# Patient Record
Sex: Male | Born: 1970 | Race: White | Hispanic: No | Marital: Married | Smoking: Never smoker
Health system: Southern US, Community
[De-identification: ages and names within clinical notes are randomized; demographics above are authoritative.]

## PROBLEM LIST (undated history)

## (undated) DIAGNOSIS — K2970 Gastritis, unspecified, without bleeding: Secondary | ICD-10-CM

## (undated) DIAGNOSIS — B9681 Helicobacter pylori [H. pylori] as the cause of diseases classified elsewhere: Secondary | ICD-10-CM

## (undated) DIAGNOSIS — K219 Gastro-esophageal reflux disease without esophagitis: Secondary | ICD-10-CM

## (undated) HISTORY — DX: Helicobacter pylori (H. pylori) as the cause of diseases classified elsewhere: K29.70

## (undated) HISTORY — DX: Helicobacter pylori (H. pylori) as the cause of diseases classified elsewhere: B96.81

---
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
## 1991-08-14 HISTORY — PX: CHOLECYSTECTOMY: SHX55

## 2007-01-24 ENCOUNTER — Ambulatory Visit (HOSPITAL_COMMUNITY): Admission: RE | Admit: 2007-01-24 | Discharge: 2007-01-24 | Payer: Self-pay | Admitting: Family Medicine

## 2011-07-18 ENCOUNTER — Encounter: Payer: Self-pay | Admitting: Internal Medicine

## 2011-07-19 ENCOUNTER — Encounter: Payer: Self-pay | Admitting: Gastroenterology

## 2011-07-19 ENCOUNTER — Ambulatory Visit (INDEPENDENT_AMBULATORY_CARE_PROVIDER_SITE_OTHER): Payer: 59 | Admitting: Gastroenterology

## 2011-07-19 VITALS — BP 143/85 | HR 80 | Temp 98.0°F | Ht 71.0 in | Wt 186.8 lb

## 2011-07-19 DIAGNOSIS — R1013 Epigastric pain: Secondary | ICD-10-CM

## 2011-07-19 MED ORDER — DEXLANSOPRAZOLE 60 MG PO CPDR: 60.0000 mg | DELAYED_RELEASE_CAPSULE | Freq: Every day | ORAL | Status: DC

## 2011-07-19 MED ORDER — ESOMEPRAZOLE MAGNESIUM 40 MG PO CPDR: 40.0000 mg | DELAYED_RELEASE_CAPSULE | Freq: Every day | ORAL | Status: DC

## 2011-07-19 NOTE — Assessment & Plan Note (Signed)
40 year old male with remote hx of H.pylori gastritis (per pt). He has had an EGD by Dr. Jena Gauss > 10 years ago. We have requested reports. Continued epigastric pain, burning, nausea despite several different PPIs. Empirically treated with Prevpac in Feb 2012 without relief. No wt loss, dysphagia, GERD, melena, hematochezia. Will need EGD for ongoing epigastric pain. Concern for gastritis, PUD. As noted in HPI, 2 incidences of diarrhea after eating [place]. Likely food-related, doubt correlated with current condition.  We will switch from Prevacid to Dexilant (He has tried Prevacid, Prilosec, and OTC Prilosec) Proceed with upper endoscopy in the near future with Dr. Jena Gauss. The risks, benefits, and alternatives have been discussed in detail with patient. They have stated understanding and desire to proceed.

## 2011-07-19 NOTE — Progress Notes (Signed)
Referring Provider: No ref. provider found Primary Care Physician:  Harlow Asa, MD, MD Primary Gastroenterologist:  Dr. Jena Gauss   Chief Complaint  Patient presents with  . Abdominal Pain    gastritis    HPI:   Kent Pearson is a pleasant 40 year old male who presents today at the request of Dr. Gerda Diss secondary to chronic epigastric pain. He was actually seen by our office in the remote past. We have requested procedure reports from EGD and path from > 10 years ago. He reports at that time he was diagnosed with H.pylori gastritis.  Since Nov 2011 he has experienced intermittent epigastric pain, burning in nature. He has taken Prevacid, Prilosec, and generic Prilosec. Prevacid has seemed the best out of all of these. Pain still continues. Wakes at night with nausea. No vomiting. Was treated empirically with Prevpac in Feb 2012 with no improvement of symptoms. No melena or hematochezia. He does note 2 episodes of diarrhea after eating [place], otherwise no constipation or frequent diarrhea. No loss of appetite, wt loss, reflux, or dysphagia.   Remote hx of using Ibuprofen in the past but has avoided since Jan 2012.   Past Medical History  Diagnosis Date  . Helicobacter pylori gastritis     18+ years ago per pt report    Past Surgical History  Procedure Date  . Cholecystectomy 1993  . Collapsed lung 1988  . Eye surgery 1983    left    Current Outpatient Prescriptions  Medication Sig Dispense Refill  . dexlansoprazole (DEXILANT) 60 MG capsule Take 1 capsule (60 mg total) by mouth daily.  30 capsule  3  . lansoprazole (PREVACID) 30 MG capsule Take 30 mg by mouth daily.          Allergies as of 07/19/2011 - Review Complete 07/19/2011  Allergen Reaction Noted  . Augmentin Nausea And Vomiting 07/19/2011    Family History  Problem Relation Age of Onset  . Colon cancer Neg Hx     History   Social History  . Marital Status: Single    Spouse Name: N/A    Number of Children: N/A   . Years of Education: N/A   Occupational History  . Not on file.   Social History Main Topics  . Smoking status: Never Smoker   . Smokeless tobacco: Not on file  . Alcohol Use: No  . Drug Use: No  . Sexually Active: Not on file   Other Topics Concern  . Not on file   Social History Narrative  . No narrative on file    Review of Systems: Gen: Denies any fever, chills, loss of appetite, fatigue, weight loss. CV: Denies chest pain, heart palpitations, syncope, peripheral edema. Resp: Denies shortness of breath with rest, cough, wheezing GI: SEE HPI GU : Denies urinary burning, urinary frequency, urinary incontinence.  MS: Denies joint pain, muscle weakness, cramps, limited movement Derm: Denies rash, itching, dry skin Psych: Denies depression, anxiety, confusion or memory loss  Heme: Denies bruising, bleeding, and enlarged lymph nodes.  Physical Exam: BP 143/85  Pulse 80  Temp(Src) 98 F (36.7 C) (Temporal)  Ht 5\' 11"  (1.803 m)  Wt 186 lb 12.8 oz (84.732 kg)  BMI 26.05 kg/m2 General:   Alert and oriented. Well-developed, well-nourished, pleasant and cooperative. Head:  Normocephalic and atraumatic. Eyes:  Conjunctiva pink, sclera clear, no icterus.    Ears:  Normal auditory acuity. Nose:  No deformity, discharge,  or lesions. Mouth:  No deformity or lesions, mucosa pink and  moist.  Neck:  Supple, without mass or thyromegaly. Lungs:  Clear to auscultation bilaterally, without wheezing, rales, or rhonchi.  Heart:  S1, S2 present without murmurs noted.  Abdomen:  +BS, soft, notably tender to palpation epigastric region. non-distended. Without mass or HSM. No rebound or guarding. No hernias noted. Rectal:  Deferred  Msk:  Symmetrical without gross deformities. Normal posture. Extremities:  Without clubbing or edema. Neurologic:  Alert and  oriented x4;  grossly normal neurologically. Skin:  Intact, warm and dry without significant lesions or rashes Cervical Nodes:  No  significant cervical adenopathy. Psych:  Alert and cooperative. Normal mood and affect.

## 2011-07-19 NOTE — Patient Instructions (Signed)
Stop Prevacid. Start taking Dexilant daily, the same time each day. We have provided samples as well. We may have to discuss coverage with your insurance company, but you have tried several other medications so this may help.  We have set you up for an endoscopy with Dr. Jena Gauss in the near future. Further recommendations to follow.  Merry Christmas!

## 2011-07-20 NOTE — Progress Notes (Signed)
Cc to PCP 

## 2011-07-26 ENCOUNTER — Telehealth: Payer: Self-pay | Admitting: Urgent Care

## 2011-07-26 NOTE — Telephone Encounter (Signed)
Correction:  I can send either one to his pharmacy.

## 2011-07-26 NOTE — Telephone Encounter (Signed)
Please call patient. His insurance will not cover Dexilant until he has tried either Nexium or generic omeprazole/sodium bicarbonate. I can't stand even her [facility] pharmacy. Let me know what he prefers. Thanks

## 2011-07-31 ENCOUNTER — Encounter (HOSPITAL_COMMUNITY): Payer: Self-pay | Admitting: Pharmacy Technician

## 2011-08-01 NOTE — Telephone Encounter (Signed)
Tried to call pt but no answer

## 2011-08-09 ENCOUNTER — Telehealth: Payer: Self-pay | Admitting: Gastroenterology

## 2011-08-09 NOTE — Telephone Encounter (Signed)
Pt called to cancel EGD scheduled for 12/28- gave no reason- will call back when he is ready to reschedule - Endo made aware

## 2011-08-10 ENCOUNTER — Encounter (HOSPITAL_COMMUNITY): Admission: RE | Payer: Self-pay | Source: Ambulatory Visit

## 2011-08-10 ENCOUNTER — Ambulatory Visit (HOSPITAL_COMMUNITY): Admission: RE | Admit: 2011-08-10 | Payer: 59 | Source: Ambulatory Visit | Admitting: Internal Medicine

## 2011-08-10 SURGERY — EGD (ESOPHAGOGASTRODUODENOSCOPY)
Anesthesia: Moderate Sedation

## 2011-08-10 NOTE — Telephone Encounter (Signed)
Tried to call pt- LMOM 

## 2011-08-13 NOTE — Telephone Encounter (Signed)
Unable to reach pt- mailed letter.  

## 2012-01-27 ENCOUNTER — Emergency Department (HOSPITAL_COMMUNITY)
Admission: EM | Admit: 2012-01-27 | Discharge: 2012-01-28 | Disposition: A | Discharge status: Home / Self Care | Payer: Self-pay | Attending: Emergency Medicine | Admitting: Emergency Medicine

## 2012-01-27 ENCOUNTER — Encounter (HOSPITAL_COMMUNITY): Payer: Self-pay

## 2012-01-27 DIAGNOSIS — S91109A Unspecified open wound of unspecified toe(s) without damage to nail, initial encounter: Secondary | ICD-10-CM | POA: Insufficient documentation

## 2012-01-27 DIAGNOSIS — W2209XA Striking against other stationary object, initial encounter: Secondary | ICD-10-CM | POA: Insufficient documentation

## 2012-01-27 DIAGNOSIS — Z23 Encounter for immunization: Secondary | ICD-10-CM | POA: Insufficient documentation

## 2012-01-27 DIAGNOSIS — S91119A Laceration without foreign body of unspecified toe without damage to nail, initial encounter: Secondary | ICD-10-CM

## 2012-01-27 NOTE — ED Provider Notes (Signed)
History     CSN: [id number]  Arrival date & time 01/27/12  2219   First MD Initiated Contact with Patient 01/27/12 2303      Chief Complaint  Patient presents with  . Toe Injury    (Consider location/radiation/quality/duration/timing/severity/associated sxs/prior treatment) HPI Comments: Patient c/o pain and laceration to the right great toe after he struck his toe on a brick step earlier today.  Patient has cleaned the area PTA, but was advised by family members that he needed "to have that checked out".  He denies numbness, pain to the remaining foot or ankle or persistent bleeding.  States his tetanus status is unknown  The history is provided by the patient.    Past Medical History  Diagnosis Date  . Helicobacter pylori gastritis     18+ years ago per pt report    Past Surgical History  Procedure Date  . Cholecystectomy 1993  . Collapsed lung 1988  . Eye surgery 1983    left    Family History  Problem Relation Age of Onset  . Colon cancer Neg Hx     History  Substance Use Topics  . Smoking status: Never Smoker   . Smokeless tobacco: Not on file  . Alcohol Use: No      Review of Systems  Constitutional: Negative for fever and chills.  Genitourinary: Negative for dysuria and difficulty urinating.  Musculoskeletal: Negative for joint swelling, arthralgias and gait problem.  Skin: Positive for wound. Negative for color change.  Hematological: Does not bruise/bleed easily.  All other systems reviewed and are negative.    Allergies  Amoxicillin-pot clavulanate  Home Medications   Current Outpatient Rx  Name Route Sig Dispense Refill  . IBUPROFEN 200 MG PO TABS Oral Take 200 mg by mouth as needed. For pain    . LORATADINE 10 MG PO TABS Oral Take 10 mg by mouth daily.    Marland Kitchen OMEPRAZOLE 20 MG PO CPDR Oral Take 20 mg by mouth daily.      . ACETAMINOPHEN 500 MG PO TABS Oral Take 500 mg by mouth every 6 (six) hours as needed. Pain       BP 149/97  Pulse  81  Temp 97.9 F (36.6 C) (Oral)  Resp 22  Ht 5\' 10"  (1.778 m)  Wt 185 lb (83.915 kg)  BMI 26.54 kg/m2  SpO2 99%  Physical Exam  Nursing note and vitals reviewed. Constitutional: He is oriented to person, place, and time. He appears well-developed and well-nourished. No distress.  HENT:  Head: Normocephalic and atraumatic.  Cardiovascular: Normal rate, regular rhythm and normal heart sounds.   Pulmonary/Chest: Effort normal and breath sounds normal.  Musculoskeletal: He exhibits no edema and no tenderness.       Right foot: He exhibits laceration.  Neurological: He is alert and oriented to person, place, and time. He exhibits normal muscle tone. Coordination normal.  Skin: Laceration noted.       Superficial laceration to the plantar surface to the right great toe.  Bleeding controlled.  No muscle, nerve or tendon injury seen    ED Course  Procedures (including critical care time)  Labs Reviewed - No data to display    Dg Toe Great Right  01/28/2012  *RADIOLOGY REPORT*  Clinical Data: Caught right big toe on brick, with laceration on the plantar surface of the toe.  RIGHT GREAT TOE  Comparison: None.  Findings: The known soft tissue laceration is difficult to fully characterize on radiograph.  There is no evidence of fracture or dislocation.  Visualized joint spaces are preserved.  IMPRESSION: No evidence of fracture or dislocation.  Original Report Authenticated By: Tonia Ghent, M.D.    MDM     LACERATION REPAIR Performed by: Lyanna Blystone L. Authorized by: Maxwell Caul Consent: Verbal consent obtained. Risks and benefits: risks, benefits and alternatives were discussed Consent given by: patient Patient identity confirmed: provided demographic data Prepped and Draped in normal sterile fashion Wound explored  Laceration Location: plantar surface of right great toe Laceration Length: 3cm  No Foreign Bodies seen or palpated  Anesthesia: local  infiltration  Local anesthetic: lidocaine 1% w/o epinephrine  Anesthetic total: 2ml  Irrigation method: syringe Amount of cleaning: standard  Skin closure: steri-strips   Patient tolerance: Patient tolerated the procedure well with no immediate complications.        TDaP given.  Wound bandaged by nursing staff.  Pt agrees to return for any signs of infection   Patient / Family / Caregiver understand and agree with initial ED impression and plan with expectations set for ED visit. Pt stable in ED with no significant deterioration in condition. Pt feels improved after observation and/or treatment in ED.    Cashe Gatt L. [place], [place] 01/31/12 [phone]

## 2012-01-27 NOTE — ED Notes (Signed)
Hit my toe over brick step and peeled the meat back per pt.

## 2012-01-28 ENCOUNTER — Emergency Department (HOSPITAL_COMMUNITY): Payer: Self-pay

## 2012-01-28 MED ORDER — LIDOCAINE HCL (PF) 1 % IJ SOLN
5.0000 mL | Freq: Once | INTRAMUSCULAR | Status: AC
  Administered 2012-01-28: 5 mL
  Filled 2012-01-28: qty 5

## 2012-01-28 MED ORDER — TETANUS-DIPHTH-ACELL PERTUSSIS 5-2.5-18.5 LF-MCG/0.5 IM SUSP
0.5000 mL | Freq: Once | INTRAMUSCULAR | Status: AC
  Administered 2012-01-28: 0.5 mL via INTRAMUSCULAR
  Filled 2012-01-28 (×2): qty 0.5

## 2012-01-28 MED ORDER — TETANUS-DIPHTHERIA TOXOIDS TD 5-2 LFU IM INJ
INJECTION | INTRAMUSCULAR | Status: AC
  Filled 2012-01-28: qty 0.5

## 2012-01-28 NOTE — Discharge Instructions (Signed)
Laceration Care, Adult A laceration is a cut that goes through all layers of the skin. The cut goes into the tissue beneath the skin. HOME CARE For stitches (sutures) or staples:  Keep the cut clean and dry.   If you have a bandage (dressing), change it at least once a day. Change the bandage if it gets wet or dirty, or as told by your doctor.   Wash the cut with soap and water 2 times a day. Rinse the cut with water. Pat it dry with a clean towel.   Put a thin layer of medicated cream on the cut as told by your doctor.   You may shower after the first 24 hours. Do not soak the cut in water until the stitches are removed.   Only take medicines as told by your doctor.   Have your stitches or staples removed as told by your doctor.  For skin adhesive strips:  Keep the cut clean and dry.   Do not get the strips wet. You may take a bath, but be careful to keep the cut dry.   If the cut gets wet, pat it dry with a clean towel.   The strips will fall off on their own. Do not remove the strips that are still stuck to the cut.  For wound glue:  You may shower or take baths. Do not soak or scrub the cut. Do not swim. Avoid heavy sweating until the glue falls off on its own. After a shower or bath, pat the cut dry with a clean towel.   Do not put medicine on your cut until the glue falls off.   If you have a bandage, do not put tape over the glue.   Avoid lots of sunlight or tanning lamps until the glue falls off. Put sunscreen on the cut for the first year to reduce your scar.   The glue will fall off on its own. Do not pick at the glue.  You may need a tetanus shot if:  You cannot remember when you had your last tetanus shot.   You have never had a tetanus shot.  If you need a tetanus shot and you choose not to have one, you may get tetanus. Sickness from tetanus can be serious. GET HELP RIGHT AWAY IF:   Your pain does not get better with medicine.   Your arm, hand, leg, or  foot loses feeling (numbness) or changes color.   Your cut is bleeding.   Your joint feels weak, or you cannot use your joint.   You have painful lumps on your body.   Your cut is red, puffy (swollen), or painful.   You have a red line on the skin near the cut.   You have yellowish-white fluid (pus) coming from the cut.   You have a fever.   You have a bad smell coming from the cut or bandage.   Your cut breaks open before or after stitches are removed.   You notice something coming out of the cut, such as wood or glass.   You cannot move a finger or toe.  MAKE SURE YOU:   Understand these instructions.   Will watch your condition.   Will get help right away if you are not doing well or get worse.  Document Released: 01/16/2008 Document Revised: 07/19/2011 Document Reviewed: 01/23/2011 Exit[employer] Patient Information 2012 [employer].Stitches, Staples, or Skin Adhesive Strips  Stitches (sutures), staples, and skin adhesive strips hold   the skin together as it heals. They will usually be in place for 7 days or less. HOME CARE  Wash your hands with soap and water before and after you touch your wound.   Only take medicine as told by your doctor.   Cover your wound only if your doctor told you to. Otherwise, leave it open to air.   Do not get your stitches wet or dirty. If they get dirty, dab them gently with a clean washcloth. Wet the washcloth with soapy water. Do not rub. Pat them dry gently.   Do not put medicine or medicated cream on your stitches unless your doctor told you to.   Do not take out your own stitches or staples. Skin adhesive strips will fall off by themselves.   Do not pick at the wound. Picking can cause an infection.   Do not miss your follow-up appointment.   If you have problems or questions, call your doctor.  GET HELP RIGHT AWAY IF:   You have a temperature by mouth above 102 F (38.9 C), not controlled by medicine.   You have chills.     You have redness or pain around your stitches.   There is puffiness (swelling) around your stitches.   You notice fluid (drainage) from your stitches.   There is a bad smell coming from your wound.  MAKE SURE YOU:  Understand these instructions.   Will watch your condition.   Will get help if you are not doing well or get worse.  Document Released: 05/27/2009 Document Revised: 07/19/2011 Document Reviewed: 05/27/2009 [employer] Patient Information 2012 [employer]. 

## 2012-02-01 NOTE — ED Provider Notes (Signed)
Medical screening examination/treatment/procedure(s) were performed by non-physician practitioner and as supervising physician I was immediately available for consultation/collaboration.  Nicoletta Dress. Colon Branch, MD 02/01/12 [phone]

## 2013-03-11 ENCOUNTER — Encounter: Payer: Self-pay | Admitting: Family Medicine

## 2013-03-11 ENCOUNTER — Ambulatory Visit (INDEPENDENT_AMBULATORY_CARE_PROVIDER_SITE_OTHER): Payer: Managed Care, Other (non HMO) | Admitting: Family Medicine

## 2013-03-11 VITALS — BP 132/90 | Temp 99.4°F | Wt 193.0 lb

## 2013-03-11 DIAGNOSIS — Z0000 Encounter for general adult medical examination without abnormal findings: Secondary | ICD-10-CM

## 2013-03-11 DIAGNOSIS — R220 Localized swelling, mass and lump, head: Secondary | ICD-10-CM

## 2013-03-11 DIAGNOSIS — R5381 Other malaise: Secondary | ICD-10-CM

## 2013-03-11 DIAGNOSIS — R221 Localized swelling, mass and lump, neck: Secondary | ICD-10-CM

## 2013-03-11 MED ORDER — AZITHROMYCIN 250 MG PO TABS: ORAL_TABLET | ORAL | Status: DC

## 2013-03-11 NOTE — Progress Notes (Signed)
  Subjective:    Patient ID: Kent Pearson, male    DOB: 09/18/1970, 42 y.o.   MRN: [id number]  Neck Pain  This is a new problem. The current episode started 1 to 4 weeks ago. The pain is present in the left side. The symptoms are aggravated by swallowing. Associated symptoms include a fever and pain with swallowing. He has tried NSAIDs for the symptoms. The treatment provided moderate relief.   He states that the pain he awoke him up in the middle night about 1 AM. He denies any typical sore throat symptoms no fever chills or congestion temperature today when he came in was slightly elevated he has not been around anyone is been sick. He has noticed swelling on the left side of his neck. On physical exam he does have some tenderness on the left side of the neck slightly swollen throat is normal eardrums normal I am concerned about the possibility of a growth going on. I do believe that we need to make sure that there is not a secondary issue going on   Review of Systems  Constitutional: Positive for fever.  HENT: Positive for neck pain.    Patient does state low but a body aches today and feels like a low-grade fever. He denies soreness in the true sense of a sore throat but he does relate soreness on the left side of the neck at the proximal anterior left side of the neck he's never had this before. He denies night sweats or chills    Objective:   Physical Exam Exam some fullness on the left side there is no discrete mass. There is some tenderness on that side. His throat is nonerythematous the neck is supple there is no rashes lungs are clear no crackles heart is regular abdomen is soft no masses felt there.       Assessment & Plan:  Left side neck pain, there is some concern for the possibility of a growth present but I suspect more likely this is inflammatory lymphadenitis. We will go ahead with Z-Pak. In addition to this test lab work. I also recommended a CT scan of the neck but if his  symptoms resolve before the scan then there is no need to do this. We will touch base with ENT about his issue.

## 2013-03-12 LAB — CBC WITH DIFFERENTIAL/PLATELET
Basophils Relative: 1 % (ref 0–1)
Eosinophils Absolute: 0.1 10*3/uL (ref 0.0–0.7)
HCT: 40.9 % (ref 39.0–52.0)
Hemoglobin: 14.2 g/dL (ref 13.0–17.0)
MCH: 28.7 pg (ref 26.0–34.0)
MCHC: 34.7 g/dL (ref 30.0–36.0)
Monocytes Absolute: 0.4 10*3/uL (ref 0.1–1.0)
Monocytes Relative: 9 % (ref 3–12)
Neutro Abs: 2.5 10*3/uL (ref 1.7–7.7)

## 2013-03-13 LAB — LIPID PANEL
LDL Cholesterol: 75 mg/dL (ref 0–99)
Total CHOL/HDL Ratio: 2.9 Ratio
Triglycerides: 92 mg/dL (ref <150)
VLDL: 18 mg/dL (ref 0–40)

## 2013-03-13 LAB — BASIC METABOLIC PANEL
Chloride: 102 mEq/L (ref 96–112)
Creat: 0.85 mg/dL (ref 0.50–1.35)

## 2013-03-13 LAB — T4, FREE: Free T4: 1.35 ng/dL (ref 0.80–1.80)

## 2013-03-13 LAB — TSH: TSH: 0.61 u[IU]/mL (ref 0.350–4.500)

## 2013-03-17 ENCOUNTER — Ambulatory Visit (HOSPITAL_COMMUNITY): Payer: Managed Care, Other (non HMO)

## 2013-04-01 ENCOUNTER — Ambulatory Visit (INDEPENDENT_AMBULATORY_CARE_PROVIDER_SITE_OTHER): Payer: Managed Care, Other (non HMO) | Admitting: Family Medicine

## 2013-04-01 ENCOUNTER — Encounter: Payer: Self-pay | Admitting: Family Medicine

## 2013-04-01 VITALS — BP 126/90 | Temp 98.4°F | Ht 70.5 in | Wt 189.4 lb

## 2013-04-01 DIAGNOSIS — K2970 Gastritis, unspecified, without bleeding: Secondary | ICD-10-CM | POA: Insufficient documentation

## 2013-04-01 MED ORDER — ESOMEPRAZOLE MAGNESIUM 40 MG PO CPDR: 40.0000 mg | DELAYED_RELEASE_CAPSULE | Freq: Every day | ORAL | Status: DC

## 2013-04-01 NOTE — Progress Notes (Signed)
  Subjective:    Patient ID: Kent Pearson, male    DOB: 02-04-1971, 42 y.o.   MRN: [id number]  HPI Patient is having an burning sensation in his stomach that feels like indigestion and he is also having body aches Patient states at times he feels tired and fatigued he denies muscle aches is more aches in his abdomen a burning sensation feels like indigestion comes and goes. Does not wake him up at night. Medication seems to help but now it's not helping his well as it use to. He denies any vomiting denies any rectal bleeding no weight loss denies sweats denies appetite change denies depression no chest pressure or tightness no shortness of breath no swelling in the legs no easy bruising denies muscle aches joint pain denies fevers denies chills denies tick bites denies dysuria. Past medical history-history of reflux and gastritis. Patient does state he had H. pylori in the past but has not had any problem with it recently that he knows of. He states his appetite is good. Family history noncontributory Does not smoke It should be noted that the neck discomfort for which she was seen on last visit his goal and also his lab work looked reassuring. In on today's exam there is no findings in the neck. Review of Systems See above.    Objective:   Physical Exam Neck no masses lungs are clear no crackles heart is regular pulses normal abdomen is soft no guarding or rebound or tenderness. No masses are felt. Extremities no edema.       Assessment & Plan:  Of abdominal dyspepsia up/gastritis-switched to Nexium. Also avoid tomato-based products for which patient state he has been needing moderate amount. Also avoid all anti-inflammatories minimize caffeine and minimize chocolates. In addition to this I recommend that this patient be rechecked again in 2-3 weeks if he is not well at that point next that referral to gastroenterology for endoscopy. Patient may also need to have ultrasound I don't feel he  needs CT scan.

## 2013-04-01 NOTE — Patient Instructions (Signed)
Avoid caffiene, anti inflamatories, tomato based products and chocalates  Recheck in 3 to 4 weeks  Call if worse

## 2013-04-23 ENCOUNTER — Encounter: Payer: Self-pay | Admitting: Family Medicine

## 2013-04-23 ENCOUNTER — Ambulatory Visit (INDEPENDENT_AMBULATORY_CARE_PROVIDER_SITE_OTHER): Payer: Managed Care, Other (non HMO) | Admitting: Family Medicine

## 2013-04-23 VITALS — BP 134/92 | Ht 70.5 in | Wt 191.4 lb

## 2013-04-23 DIAGNOSIS — K2970 Gastritis, unspecified, without bleeding: Secondary | ICD-10-CM

## 2013-04-23 DIAGNOSIS — H8110 Benign paroxysmal vertigo, unspecified ear: Secondary | ICD-10-CM

## 2013-04-23 DIAGNOSIS — R1013 Epigastric pain: Secondary | ICD-10-CM

## 2013-04-23 MED ORDER — SUCRALFATE 1 G PO TABS: 1.0000 g | ORAL_TABLET | Freq: Four times a day (QID) | ORAL | Status: DC

## 2013-04-23 NOTE — Progress Notes (Signed)
  Subjective:    Patient ID: Kent Pearson, male    DOB: 10/22/1970, 42 y.o.   MRN: [id number]  HPI Here today to f/u on higher dose of Nexium.   He has been feeling dizzy and having motion sickness ever since the Nexium was increased.  Started having around then motion sickness deriving to work  Gotten worse since, getting sick just driving to work.  feelstrue dizziness with sudden motion, mot sick pills helped some.  Seems they are connnected.  Trouble with still having stomach.tender and sore in the stomach. Burp might help  Hx of biopsy showing h pylori in the past.  . Patient also notes diminished energy.  Review of Systems No vomiting no chest pain no abdominal pain no change in bowel habits ROS otherwise negative    Objective:   Physical Exam Alert no acute distress. Lungs clear. Heart regular rate and rhythm. HEENT normal. Neuro function grossly intact. Abdomen epigastric tenderness evident.       Assessment & Plan:  Impression #1 perceived side effects with vertigo apparently made worse by Nexium. Of note patient has had vertigo in the past. #2 persistent gastritis symptoms minimal improvement. #3 fatigue. Plan 25 minutes spent most in discussion. Maintain omeprazole. Add Carafate a.c. and at bedtime. GI consult rationale discussed. If fatigue persists recommend physical exam. WSL

## 2013-04-24 DIAGNOSIS — H8110 Benign paroxysmal vertigo, unspecified ear: Secondary | ICD-10-CM | POA: Insufficient documentation

## 2013-04-27 ENCOUNTER — Telehealth: Payer: Self-pay | Admitting: Family Medicine

## 2013-04-27 NOTE — Telephone Encounter (Signed)
Pt wants to know if there is another med aside from the omeprazole that he can take? He hasn't been taking it everyday, had to last night due to severe heartburn and now he is having dizzy spells again. Please call or send to [facility], but prefers OTC .   Pt would like to use Dr Karilyn Cota for the stomach scope if he can at all possible. Its who he dealt with in the past.

## 2013-04-27 NOTE — Telephone Encounter (Signed)
Left message to return call 

## 2013-04-27 NOTE — Telephone Encounter (Signed)
Send this to brendale--wants rehm.  If even low dose omeprazole causes dizziness (unusual) swith to zantac 150 bid. I already added carafate a few days ago as an addtnl med

## 2013-04-28 NOTE — Telephone Encounter (Signed)
Advised patient:  If even low dose omeprazole causes dizziness (unusual) swith to zantac 150 bid. I already added carafate a few days ago as an addtnl med   Routed message to referral coordinator.

## 2013-04-29 ENCOUNTER — Emergency Department (HOSPITAL_COMMUNITY)
Admission: EM | Admit: 2013-04-29 | Discharge: 2013-04-30 | Disposition: A | Discharge status: Home / Self Care | Payer: Managed Care, Other (non HMO) | Attending: Emergency Medicine | Admitting: Emergency Medicine

## 2013-04-29 ENCOUNTER — Encounter (HOSPITAL_COMMUNITY): Payer: Self-pay | Admitting: *Deleted

## 2013-04-29 DIAGNOSIS — K219 Gastro-esophageal reflux disease without esophagitis: Secondary | ICD-10-CM | POA: Insufficient documentation

## 2013-04-29 DIAGNOSIS — Z8619 Personal history of other infectious and parasitic diseases: Secondary | ICD-10-CM | POA: Insufficient documentation

## 2013-04-29 DIAGNOSIS — Z79899 Other long term (current) drug therapy: Secondary | ICD-10-CM | POA: Insufficient documentation

## 2013-04-29 DIAGNOSIS — H81399 Other peripheral vertigo, unspecified ear: Secondary | ICD-10-CM | POA: Insufficient documentation

## 2013-04-29 HISTORY — DX: Gastro-esophageal reflux disease without esophagitis: K21.9

## 2013-04-29 NOTE — ED Provider Notes (Signed)
CSN: [id number]     Arrival date & time 04/29/13  2201 History   First MD Initiated Contact with Patient 04/29/13 2356     Chief Complaint  Patient presents with  . Dizziness   (Consider location/radiation/quality/duration/timing/severity/associated sxs/prior Treatment) HPI This is a 42 year old male with a one-month history of dizziness. He describes the dizziness as vertigo like symptoms. Specifically he feels like the room is spinning or he is off balance. Symptoms are worse with movement of the head. There are moderate in severity. They're associated with nausea but no vomiting. The symptoms began when he had his proton pump inhibitor dose increased about a month ago. He contact his primary care physician who took him off his PPI about a week ago and he had resolution of his vertigo. He had a return of GERD symptoms so he was started on Zantac yesterday and had a return of his vertigo. It is noted that both omeprazole and Zantac list of vertigo as common side effects. He denies hearing loss.  He has been taking 12.5 mg of meclizine twice daily with partial control of the symptoms. He avoids larger doses as they make him drowsy.  Past Medical History  Diagnosis Date  . Helicobacter pylori gastritis     18+ years ago per pt report  . Acid reflux    Past Surgical History  Procedure Laterality Date  . Collapsed lung  1988  . Eye surgery  1983    left  . Cholecystectomy  1993   Family History  Problem Relation Age of Onset  . Colon cancer Neg Hx    History  Substance Use Topics  . Smoking status: Never Smoker   . Smokeless tobacco: Not on file  . Alcohol Use: No    Review of Systems  All other systems reviewed and are negative.    Allergies  Augmentin  Home Medications   Current Outpatient Rx  Name  Route  Sig  Dispense  Refill  . Meclizine HCl (BONINE) 25 MG CHEW   Oral   Chew 12.5 mg by mouth 2 (two) times daily as needed.         . Multiple Vitamins-Minerals  (CENTRUM PO)   Oral   Take 1 tablet by mouth daily.         . Omeprazole Magnesium 20.6 (20 BASE) MG CPDR   Oral   Take 1 capsule by mouth once.         . pseudoephedrine (SUDAFED) 120 MG 12 hr tablet   Oral   Take 120 mg by mouth 2 (two) times daily as needed for congestion.         . ranitidine (ZANTAC) 150 MG tablet   Oral   Take 150 mg by mouth at bedtime.         . sucralfate (CARAFATE) 1 G tablet   Oral   Take 1 tablet (1 g total) by mouth 4 (four) times daily.   120 tablet   1    BP 163/96  Pulse 81  Temp(Src) 98 F (36.7 C) (Oral)  Resp 20  Ht 5' 10.5" (1.791 m)  Wt 190 lb (86.183 kg)  BMI 26.87 kg/m2  SpO2 100%  Physical Exam General: Well-developed, well-nourished male in no acute distress; appearance consistent with age of record HENT: normocephalic; atraumatic; TMs normal Eyes: pupils equal, round and reactive to light; extraocular muscles intact; no nystagmus Neck: supple Heart: regular rate and rhythm Lungs: clear to auscultation bilaterally Abdomen: soft; nondistended;  nontender; no masses or hepatosplenomegaly Extremities: No deformity; full range of motion; pulses normal; no edema Neurologic: Awake, alert and oriented; motor function intact in all extremities and symmetric; no facial droop; normal coordination and speech; normal gait; normal finger to nose; negative Romberg Skin: Warm and dry Psychiatric: Normal mood and affect    ED Course  Procedures (including critical care time)  MDM  The patient's symptoms may be related to the omeprazole and/or Zantac but the patient had been on omeprazole in the past without difficulty. We will attempt to obtain an MRI as an outpatient to evaluate for acoustic neuroma or other pathology. He will follow up with his primary care physician Dr. Gerda Diss.    Hanley Seamen, MD 04/30/13 [phone]

## 2013-04-29 NOTE — ED Notes (Signed)
Pt reports intermittent dizzy spells for about one month ago, gradual worsening over past month.

## 2013-04-30 NOTE — ED Notes (Signed)
Patient given discharge instruction, verbalized understand. Patient ambulatory out of the department with wife  

## 2013-05-01 ENCOUNTER — Telehealth: Payer: Self-pay | Admitting: Family Medicine

## 2013-05-01 NOTE — Telephone Encounter (Signed)
Most cases of vertigo will gradually get better with time. Meclizine will take the edge off of it. If necessary Zofran can be called in for nausea. If persistent problems occur consider ENT evaluation. Also I would advise the patient to try Epley maneuver which he could look up on Youtube which shows a procedure that he can do that were often can help reset the inner ER and calm down vertigo. If things are not doing better by Monday certainly call us and let us recheck him. If patient does desire ENT referral we can put the ball in motion.

## 2013-05-01 NOTE — Telephone Encounter (Signed)
Patient says he is still having issues with severe vertigo. He had to go to the ER earlier this week. Please advise.

## 2013-05-01 NOTE — Telephone Encounter (Signed)
Patient seen Dr. Brett Canales on 04/23/13 and was seen at the ED on 04/29/13

## 2013-05-01 NOTE — Telephone Encounter (Signed)
Patient notified most cases of vertigo will gradually get better with time. Meclizine will take the edge off of it. If necessary Zofran can be called in for nausea. If persistent problems occur consider ENT evaluation. Also advised the patient to try Epley maneuver which he could look up on Youtube which shows a procedure that he can do that were often can help reset the inner ER and calm down vertigo. If things are not doing better by Monday certainly call us and let us recheck him. If patient does desire ENT referral we can put the ball in motion. Patient verbalized understanding and wants ENT referral started.

## 2013-05-03 ENCOUNTER — Other Ambulatory Visit: Payer: Self-pay | Admitting: Family Medicine

## 2013-05-03 DIAGNOSIS — H8110 Benign paroxysmal vertigo, unspecified ear: Secondary | ICD-10-CM

## 2013-05-04 ENCOUNTER — Ambulatory Visit: Payer: Managed Care, Other (non HMO) | Admitting: Family Medicine

## 2013-05-06 ENCOUNTER — Encounter: Payer: Self-pay | Admitting: Internal Medicine

## 2013-05-08 ENCOUNTER — Telehealth: Payer: Self-pay | Admitting: Family Medicine

## 2013-05-08 NOTE — Telephone Encounter (Signed)
Left message on voicemail to call Monday and schedule office visit.

## 2013-05-08 NOTE — Telephone Encounter (Signed)
Patient is calling because he is looking into getting disability due to his dizzy spells. He would like to know who he would need to speak with about getting this started? Also, he requested to speak with the doctor to make sure they are on the same page with the disability.

## 2013-05-08 NOTE — Telephone Encounter (Signed)
Per Dr. Brett Canales : Will need office visit to discuss on Monday

## 2013-05-08 NOTE — Telephone Encounter (Incomplete)
I do not discuss disability over the phone. Appt Monday

## 2013-05-12 ENCOUNTER — Ambulatory Visit (INDEPENDENT_AMBULATORY_CARE_PROVIDER_SITE_OTHER): Payer: Managed Care, Other (non HMO) | Admitting: Family Medicine

## 2013-05-12 ENCOUNTER — Encounter: Payer: Self-pay | Admitting: Family Medicine

## 2013-05-12 VITALS — BP 142/96 | Ht 71.25 in | Wt 189.0 lb

## 2013-05-12 DIAGNOSIS — K2970 Gastritis, unspecified, without bleeding: Secondary | ICD-10-CM

## 2013-05-12 DIAGNOSIS — H8110 Benign paroxysmal vertigo, unspecified ear: Secondary | ICD-10-CM

## 2013-05-12 NOTE — Progress Notes (Signed)
  Subjective:    Patient ID: Kent Pearson, male    DOB: [birth year], 42 y.o.   MRN: [id number]  HPIconsultation for disability.  Saw dr Dallie Piles. Felt that it wasn't related to true vertigo.  Two to three per day of lorazepam.  yest took a whole pill and drove to work.  Due to see dr Kendell Bane oct 14,  Stopped stomach meds.  Has been approached by boss on disability;; spells hit. Feels worthless, hits him hard,  Disability question long term,      Review of Systems ROS otherwise negative    Objective:   Physical Exam  Alert no acute distress. Lungs clear. Heart regular in rhythm. H&T normal.      Assessment & Plan:  Impression dizziness near disabling for patient, has seen the ENT specialist. They started him on anxiety medication. I explained to him that this can often help dizziness apart from its anti-anxiety qualities. Patient has been approached by his boss about potential for going on short-term disability. Patient uncertain about whether to pursue this. Extremely long discussion held about the nature of short-term disability. Plan if patient chooses to go on short-term disability I want to this. At that time we will need to do a neurology consult also, since the symptoms are severe enough to keep him from working. Many questions answered 35 minutes spent most in discussion. WSL

## 2013-05-13 ENCOUNTER — Encounter: Payer: Self-pay | Admitting: Family Medicine

## 2013-05-13 ENCOUNTER — Telehealth: Payer: Self-pay | Admitting: Family Medicine

## 2013-05-13 NOTE — Telephone Encounter (Signed)
Work note done and pt notified

## 2013-05-13 NOTE — Telephone Encounter (Signed)
LONG discusiioon yest. Three wks out. Ov with me before finished. Neuro consult asa p--brendale to work on

## 2013-05-13 NOTE — Telephone Encounter (Signed)
Pt is going to go ahead with short term Disability. Pt would like to know if he can start this today? If so he will need a work note stating how long to take him out of work. Advised pt that he would need to get his paper work from his office for short term disability to be filled out by [employer] with $10 fee.

## 2013-05-20 ENCOUNTER — Telehealth: Payer: Self-pay | Admitting: Family Medicine

## 2013-05-20 DIAGNOSIS — R42 Dizziness and giddiness: Secondary | ICD-10-CM

## 2013-05-20 NOTE — Telephone Encounter (Addendum)
Sent referral to [facility], they review, call pt to set up, and notify us when appt is scheduled.  Called & LMOM to notify pt that [facility] will be calling him to set up appt

## 2013-05-20 NOTE — Telephone Encounter (Signed)
Patient states he has seen his Eye MD and was advised it is not an eye issue.  States Eye MD states patient may need to be seen by a Neurologist for these symptoms.  Patient completed form required to have his Disability papers completed.  Fee was paid  Call patient when this form is ready to be picked up.

## 2013-05-20 NOTE — Telephone Encounter (Signed)
See 10 1 tele call--ask brendale if we iniated neuro consult??

## 2013-05-26 ENCOUNTER — Ambulatory Visit (INDEPENDENT_AMBULATORY_CARE_PROVIDER_SITE_OTHER): Payer: Managed Care, Other (non HMO) | Admitting: Gastroenterology

## 2013-05-26 ENCOUNTER — Encounter: Payer: Self-pay | Admitting: Gastroenterology

## 2013-05-26 VITALS — BP 150/90 | HR 78 | Temp 98.0°F | Ht 70.5 in | Wt 187.6 lb

## 2013-05-26 DIAGNOSIS — K219 Gastro-esophageal reflux disease without esophagitis: Secondary | ICD-10-CM

## 2013-05-26 NOTE — Assessment & Plan Note (Signed)
42 year old with intermittent GERD and dyspepsia over the past 2 years, originally set up for an EGD in 2012 but cancelled, now presenting with resolution of symptoms overall since stopping a PPI and even H2 blockers. He feels his symptoms may be dietary related. He does admit to using Tums at the most 3 times per month. As mentioned in HPI, he is experiencing debilitating episodes of dizziness and will be seeing a neurologist in the near future. He is hesitant to trial any further medications; trying to limit medications while worked up for dizziness.  I have offered him samples of Dexilant to trial X 10 days. Provided GERD handout. I feel if he has no further symptoms and improves with dietary modification, we can hold off on an EGD. However, I have asked him to call with any recurrence or persisting symptoms, and we will need to pursue an upper GI evaluation. He has no alarm features currently. Return in 3 months.

## 2013-05-26 NOTE — Progress Notes (Signed)
cc'd to pcp 

## 2013-05-26 NOTE — Progress Notes (Signed)
Referring Provider: Merlyn Albert, MD Primary Care Physician:  Harlow Asa, MD Primary GI: Dr. Jena Gauss   Chief Complaint  Patient presents with  . Gastrophageal Reflux    HPI:   Mr. Mcdonagh presents today at the request of Dr. Gerda Diss secondary to gastritis. He was seen by myself in Dec 2012 secondary to chronic epigastric pain. Notes a remote history of H.pylori gastritis. Prior medications include Prevacid, Prilosec, generic Prilosec. Last EGD in the remote past. He was given Dexilant in Dec 2012 to trial and set up for an EGD, which he then cancelled for unknown reasons. Had to trial Nexium before Dexilant would cover.   Notes in 2012 had epigastric pain, diarrhea, usually related to certain types of food. Has been on Omeprazole for the past 2 years. Didn't want to stay on a PPI forever. Trialed Nexium once daily for a month. Didn't notice much improvement. Started having dizzy spells; motion sickness going to work, worsening over time. Started having spells at home. No syncope. Went back to Omeprazole, dizziness didn't go away, switched to Zantac, persistent dizziness. Now no GI medications. Treated empirically with regimen for possible H.pylori as the culprit. Hard to tell if this improved his symptoms or Omeprazole. He feels improved WITHOUT a PPI or H2 blockers.   Symptoms were slight burning in epigastric region, slight discomfort while on a PPI. No further discomfort X 3 weeks since stopping PPI. Intermittent indigestion after stopping PPIs and H2 blockers. Now just uses Tums at a max of three times over the month. No nausea, vomiting. Nausea associated with dizzy spells. Appetite good, no unexplained weight loss. No melena, hematochezia. Sees Neurologist late October. Has seen ENT already, eye exam as well.   Past Medical History  Diagnosis Date  . Helicobacter pylori gastritis     18+ years ago per pt report  . Acid reflux     Past Surgical History  Procedure Laterality Date  .  Collapsed lung  1988    high school  . Eye surgery  1983    left  . Cholecystectomy  1993    Current Outpatient Prescriptions  Medication Sig Dispense Refill  . LORazepam (ATIVAN) 1 MG tablet Take 1 mg by mouth. Take one-half as needed.       No current facility-administered medications for this visit.    Allergies as of 05/26/2013 - Review Complete 05/26/2013  Allergen Reaction Noted  . Augmentin [amoxicillin-pot clavulanate] Nausea And Vomiting 07/19/2011    Family History  Problem Relation Age of Onset  . Colon cancer Neg Hx     History   Social History  . Marital Status: Married    Spouse Name: N/A    Number of Children: N/A  . Years of Education: N/A   Social History Main Topics  . Smoking status: Never Smoker   . Smokeless tobacco: None  . Alcohol Use: No  . Drug Use: No  . Sexual Activity: None   Other Topics Concern  . None   Social History Narrative  . None    Review of Systems: Negative unless mentioned in HPI.   Physical Exam: BP 150/90  Pulse 78  Temp(Src) 98 F (36.7 C) (Oral)  Ht 5' 10.5" (1.791 m)  Wt 187 lb 9.6 oz (85.095 kg)  BMI 26.53 kg/m2 General:   Alert and oriented. No distress noted. Pleasant and cooperative.  Head:  Normocephalic and atraumatic. Eyes:  Conjuctiva clear without scleral icterus. Mouth:  Oral mucosa pink and moist. Good  dentition. No lesions. Heart:  S1, S2 present without murmurs, rubs, or gallops. Regular rate and rhythm. Abdomen:  +BS, soft, non-tender and non-distended. No rebound or guarding. No HSM or masses noted. Msk:  Symmetrical without gross deformities. Normal posture. Extremities:  Without edema. Neurologic:  Alert and  oriented x4;  grossly normal neurologically. Skin:  Intact without significant lesions or rashes. Psych:  Alert and cooperative. Normal mood and affect.

## 2013-05-26 NOTE — Patient Instructions (Signed)
I have provided samples of Dexilant to trial for 10 days. Please call if the abdominal discomfort returns or reflux is worsened despite continuing to adapt your eating behaviors.   We may ultimately need to do an upper endoscopy.   I will see you back in 3 months. I have attached a reflux handout for you.   Good luck with your upcoming appointments!  Diet for Gastroesophageal Reflux Disease, Adult Reflux (acid reflux) is when acid from your stomach flows up into the esophagus. When acid comes in contact with the esophagus, the acid causes irritation and soreness (inflammation) in the esophagus. When reflux happens often or so severely that it causes damage to the esophagus, it is called gastroesophageal reflux disease (GERD). Nutrition therapy can help ease the discomfort of GERD. FOODS OR DRINKS TO AVOID OR LIMIT  Smoking or chewing tobacco. Nicotine is one of the most potent stimulants to acid production in the gastrointestinal tract.  Caffeinated and decaffeinated coffee and black tea.  Regular or low-calorie carbonated beverages or energy drinks (caffeine-free carbonated beverages are allowed).   Strong spices, such as black pepper, white pepper, red pepper, cayenne, curry powder, and chili powder.  Peppermint or spearmint.  Chocolate.  High-fat foods, including meats and fried foods. Extra added fats including oils, butter, salad dressings, and nuts. Limit these to less than 8 tsp per day.  Fruits and vegetables if they are not tolerated, such as citrus fruits or tomatoes.  Alcohol.  Any food that seems to aggravate your condition. If you have questions regarding your diet, call your caregiver or a registered dietitian. OTHER THINGS THAT MAY HELP GERD INCLUDE:   Eating your meals slowly, in a relaxed setting.  Eating 5 to 6 small meals per day instead of 3 large meals.  Eliminating food for a period of time if it causes distress.  Not lying down until 3 hours after  eating a meal.  Keeping the head of your bed raised 6 to 9 inches (15 to 23 cm) by using a foam wedge or blocks under the legs of the bed. Lying flat may make symptoms worse.  Being physically active. Weight loss may be helpful in reducing reflux in overweight or obese adults.  Wear loose fitting clothing EXAMPLE MEAL PLAN This meal plan is approximately 2,000 calories based on https://www.[facility].org/ meal planning guidelines. Breakfast   cup cooked oatmeal.  1 cup strawberries.  1 cup low-fat milk.  1 oz almonds. Snack  1 cup cucumber slices.  6 oz yogurt (made from low-fat or fat-free milk). Lunch  2 slice whole-wheat bread.  2 oz sliced [place].  2 tsp mayonnaise.  1 cup blueberries.  1 cup snap peas. Snack  6 whole-wheat crackers.  1 oz string cheese. Dinner   cup brown rice.  1 cup mixed veggies.  1 tsp olive oil.  3 oz grilled fish. Document Released: 07/30/2005 Document Revised: 10/22/2011 Document Reviewed: 06/15/2011 [facility] Patient Information 2014 [place], [place].

## 2013-06-02 ENCOUNTER — Ambulatory Visit (INDEPENDENT_AMBULATORY_CARE_PROVIDER_SITE_OTHER): Payer: Managed Care, Other (non HMO) | Admitting: Family Medicine

## 2013-06-02 ENCOUNTER — Encounter: Payer: Self-pay | Admitting: Family Medicine

## 2013-06-02 VITALS — Ht 71.25 in

## 2013-06-02 DIAGNOSIS — H8110 Benign paroxysmal vertigo, unspecified ear: Secondary | ICD-10-CM

## 2013-06-02 DIAGNOSIS — R51 Headache: Secondary | ICD-10-CM

## 2013-06-02 NOTE — Progress Notes (Signed)
  Subjective:    Patient ID: Kent Pearson, male    DOB: 06/19/1971, 42 y.o.   MRN: [id number]  HPI Patient arrives for a follow up on his vertigo. Ativan is helping some as long as he is taking it but the problem returns when not taking med.  Neuro due to see twenty fourth fri  Sig motion sickness with driving, looking at computer screen--nausea sens, watching scrolling read back and forth. Not as bad as reading book   Watching back and forth causes it  Takes whole pill at bed and one half or possible another half in the eve,  Still feels completely unable to work. Now he's worried because he has been experiencing some headaches. Frontal in nature. Was told by someone else this could be a sign of something serious and his brain. Due to see a neurologist soon. Once answers before then. Review of Systems No chest pain no shortness breath no abdominal pain no weight loss weight gain no loss of consciousness no seizures ROS otherwise negative    Objective:   Physical Exam  Alert no apparent distress. Talkative. HEENT normal. Lungs clear. Heart regular in rhythm. Neuro exam intact. No focal deficits.      Assessment & Plan:  Impression persistent dizziness now with other symptomatology. Patient unable to work. Has seen the ENT x2. Due to see a neurologist soon. Now accompanied by significant headaches. Plan MRI of brain rationale discussed. Followup with neurologist work excuse extended Ativan refill recheck in several weeks 25 minutes spent most in discussion

## 2013-06-03 DIAGNOSIS — R51 Headache: Secondary | ICD-10-CM | POA: Insufficient documentation

## 2013-06-03 DIAGNOSIS — R519 Headache, unspecified: Secondary | ICD-10-CM | POA: Insufficient documentation

## 2013-06-05 ENCOUNTER — Ambulatory Visit (INDEPENDENT_AMBULATORY_CARE_PROVIDER_SITE_OTHER): Payer: Managed Care, Other (non HMO) | Admitting: Neurology

## 2013-06-05 ENCOUNTER — Encounter: Payer: Self-pay | Admitting: Neurology

## 2013-06-05 VITALS — BP 150/78 | HR 78 | Temp 98.4°F | Ht 70.0 in | Wt 187.0 lb

## 2013-06-05 DIAGNOSIS — H8120 Vestibular neuronitis, unspecified ear: Secondary | ICD-10-CM

## 2013-06-05 NOTE — Progress Notes (Signed)
NEUROLOGY CONSULTATION NOTE  Kent Pearson MRN: [id number] DOB: 09/26/1970  Referring provider: Dr. Gerda Diss Primary care provider: Dr. Gerda Diss  Reason for consult:  Dizziness  HISTORY OF PRESENT ILLNESS: Kent Pearson is a 42 year old right-handed man with history of borderline hyperglycemia, borderline high blood pressure, GERD, gallbladder removal, eye surgery and collapsed lungs due to salmonella who presents for dizziness..  Records and images were personally reviewed where available.    Onset of symptoms started about 2 months ago.  He developed a bad stomach flu where he felt nauseous and weak for 5 days.  Symptoms began soon afterward.  He first noticed feeling of motion sickness while driving to work.  This slowly progressed where he was unable to tolerate driving.  He describes feeling of movement, either of his eyes or the room.  Sometimes it is a spinning sensation but not usually.  It is associated with nausea.  On one occasion he noted numbness in his hands and fingers.  He also feels slightly unsteady on his feet.  It usually lasts a few minutes.  It can be triggered by change in position, change in head movement or scanning with his eyes (such as looking at the computer or reading).  It is not associated with headache, vomiting, tinnitus, ear fullness,hearing loss, unilateral facial numbness, dysarthria or dysphagia.  He felt that symptoms have been a little better over the past week, but overall no change.  At first, he thought it may have been secondary to omeprazole, but episodes persisted after he stopped.  He started taking Meclizine, which helped with the symptoms.  He does occasionally get non-throbbing bi-frontal headaches (no nausea) which seem consistent with tension-type headaches.  He was evaluated by ENT.  Exam revealed negative Dix-Hallpike.  There was no nystagmus with pneumatic pressure on either tympanic membrane or Valsalva but did become dizzy when moving from  supine to upright position.    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  The speech reception threshold is 15dB AD and 15dB AS.  The discrimination score is 96% AD and 96% AS.  The tympanogram is normal bilaterally.  He was advised to discontinue Meclizine and try Ativan as needed.Marland Kitchen  PAST MEDICAL HISTORY: Past Medical History  Diagnosis Date  . Helicobacter pylori gastritis     18+ years ago per pt report  . Acid reflux     PAST SURGICAL HISTORY: Past Surgical History  Procedure Laterality Date  . Collapsed lung  1988    high school  . Eye surgery  1983    left  . Cholecystectomy  1993    MEDICATIONS: Current Outpatient Prescriptions on File Prior to Visit  Medication Sig Dispense Refill  . LORazepam (ATIVAN) 1 MG tablet Take 1 mg by mouth. Take one-half as needed.       No current facility-administered medications on file prior to visit.    ALLERGIES: Allergies  Allergen Reactions  . Augmentin [Amoxicillin-Pot Clavulanate] Nausea And Vomiting    Nausea and vomiting    FAMILY HISTORY: Family History  Problem Relation Age of Onset  . Colon cancer Neg Hx     SOCIAL HISTORY: History   Social History  . Marital Status: Married    Spouse Name: N/A    Number of Children: N/A  . Years of Education: N/A   Occupational History  . Not on file.   Social History Main Topics  . Smoking status: Never Smoker   . Smokeless tobacco: Not on file  .  Alcohol Use: No  . Drug Use: No  . Sexual Activity: Not on file   Other Topics Concern  . Not on file   Social History Narrative  . No narrative on file    REVIEW OF SYSTEMS: Constitutional: No fevers, chills, or sweats, no generalized fatigue, change in appetite Eyes: No visual changes, double vision, eye pain Ear, nose and throat: No hearing loss, ear pain, nasal congestion, sore throat Cardiovascular: No chest pain, palpitations Respiratory:  No shortness of breath at rest or with  exertion, wheezes GastrointestinaI: No nausea, vomiting, diarrhea, abdominal pain, fecal incontinence Genitourinary:  No dysuria, urinary retention or frequency Musculoskeletal:  No neck pain, back pain Integumentary: No rash, pruritus, skin lesions Neurological: as above Psychiatric: No depression, insomnia, anxiety Endocrine: No palpitations, fatigue, diaphoresis, mood swings, change in appetite, change in weight, increased thirst Hematologic/Lymphatic:  No anemia, purpura, petechiae. Allergic/Immunologic: no itchy/runny eyes, nasal congestion, recent allergic reactions, rashes  PHYSICAL EXAM: Filed Vitals:   06/05/13 1334  BP: 150/78  Pulse: 78  Temp: 98.4 F (36.9 C)   General: No acute distress Head:  Normocephalic/atraumatic Neck: supple, no paraspinal tenderness, full range of motion Back: No paraspinal tenderness Heart: regular rate and rhythm Lungs: Clear to auscultation bilaterally. Vascular: No carotid bruits. Neurological Exam: Mental status: alert and oriented to person, place, and time, speech fluent and not dysarthric, language intact. Cranial nerves: CN I: not tested CN II: pupils equal, round and reactive to light, visual fields intact, fundi unremarkable. CN III, IV, VI:  full range of motion, no nystagmus, no ptosis CN V: facial sensation intact CN VII: upper and lower face symmetric CN VIII: hearing intact CN IX, X: gag intact, uvula midline CN XI: sternocleidomastoid and trapezius muscles intact CN XII: tongue midline Bulk & Tone: normal, no fasciculations. Motor: 5/5 throughout Sensation: pinprick and vibration intact Deep Tendon Reflexes: 2+ throughout, toes down Finger to nose testing: normal without dysmetria Heel to shin: normal without dysmetria Gait: normal stance and stride.  Able to walk on toes, heels and in tandem. Romberg negative.  IMPRESSION: Vestibular neuritis.  Symptoms may last weeks to months.  PLAN: 1.  He has an upcoming MRI  of brain scheduled.  We will modify the order to include contrast as well (with attention to posterior fossa). 2.  If MRI unremarkable, I recommend referral to vestibular rehab.  No further neurologic workup warranted.  45 minutes spent with patient, over 50% spent counseling and coordinating care.  Thank you for allowing me to take part in the care of this patient.  Shon Millet, DO  CC:  Ardyth Gal, MD

## 2013-06-05 NOTE — Patient Instructions (Addendum)
Most likely you have an inflammation of your vestibular nerve.  This may be due to the viral illness you had earlier.  Symptoms may last weeks to months.  I don't think it is anything serious, but I do agree with the MRI of the brain.  I would like it to be performed with IV contrast as well.  If that looks okay, then I would recommend vestibular rehab.

## 2013-06-08 ENCOUNTER — Ambulatory Visit (HOSPITAL_COMMUNITY)
Admission: RE | Admit: 2013-06-08 | Discharge: 2013-06-08 | Disposition: A | Discharge status: Home / Self Care | Payer: Managed Care, Other (non HMO) | Source: Ambulatory Visit | Attending: Family Medicine | Admitting: Family Medicine

## 2013-06-08 DIAGNOSIS — J3489 Other specified disorders of nose and nasal sinuses: Secondary | ICD-10-CM | POA: Insufficient documentation

## 2013-06-08 DIAGNOSIS — R42 Dizziness and giddiness: Secondary | ICD-10-CM | POA: Insufficient documentation

## 2013-06-08 MED ORDER — GADOBENATE DIMEGLUMINE 529 MG/ML IV SOLN
15.0000 mL | Freq: Once | INTRAVENOUS | Status: AC | PRN
  Administered 2013-06-08: 15 mL via INTRAVENOUS

## 2013-06-10 ENCOUNTER — Telehealth: Payer: Self-pay | Admitting: Neurology

## 2013-06-10 ENCOUNTER — Other Ambulatory Visit: Payer: Self-pay | Admitting: Neurology

## 2013-06-10 DIAGNOSIS — H8120 Vestibular neuronitis, unspecified ear: Secondary | ICD-10-CM

## 2013-06-10 DIAGNOSIS — R42 Dizziness and giddiness: Secondary | ICD-10-CM

## 2013-06-10 DIAGNOSIS — Z0289 Encounter for other administrative examinations: Secondary | ICD-10-CM

## 2013-06-10 NOTE — Telephone Encounter (Signed)
Will not let me close either said that doctor has unfinished note and must be closed by dr.  

## 2013-06-10 NOTE — Telephone Encounter (Signed)
Left a message for the patient to return my call.  

## 2013-06-10 NOTE — Telephone Encounter (Signed)
Yes, vestibular rehab.

## 2013-06-10 NOTE — Telephone Encounter (Signed)
Pt was told MRi results normal by Dr. Cathlyn Parsons office. Pt wants to see what is next step for him. He is still out of work due to dizziness. His short term disability expires 06/15/13. CB# [phone] / Sherri S.

## 2013-06-10 NOTE — Telephone Encounter (Signed)
Please close this encounter. It states it has notes that need to be competed.

## 2013-06-10 NOTE — Telephone Encounter (Signed)
**  Dr. Everlena Cooper, MRI was normal and according to your office note, next step is Vestibular Rehab. OK to refer to [facility] for that?

## 2013-06-10 NOTE — Telephone Encounter (Signed)
Spoke with [employer]. Information given as per Dr. Everlena Cooper re: Vestibular Rehab. Pt in agreement with this plan. Will fax over the referral. Patient aware they will call him to schedule.

## 2013-06-12 ENCOUNTER — Telehealth: Payer: Self-pay | Admitting: Family Medicine

## 2013-06-12 ENCOUNTER — Other Ambulatory Visit: Payer: Self-pay | Admitting: *Deleted

## 2013-06-12 MED ORDER — LORAZEPAM 1 MG PO TABS: ORAL_TABLET | ORAL | Status: DC

## 2013-06-12 NOTE — Telephone Encounter (Signed)
Refill times one, f/u with Brett Canales if ongoing need

## 2013-06-12 NOTE — Telephone Encounter (Signed)
Med sent to pharm. Pt notified on his voicemail

## 2013-06-12 NOTE — Telephone Encounter (Signed)
Patient states he needs a prescription for LORazepam (ATIVAN) 1 MG tablet, however this medication was prescribed by Dr. Suszanne Conners.  Wants to know if our office can prescribe this medication for him.  He will not have any of these left after tomorrow  06/13/2013.  [facility]  Please call patient to discuss to see if we can do this for him today.

## 2013-06-17 ENCOUNTER — Ambulatory Visit
Payer: Managed Care, Other (non HMO) | Attending: Neurology | Admitting: Rehabilitative and Restorative Service Providers"

## 2013-06-17 DIAGNOSIS — R42 Dizziness and giddiness: Secondary | ICD-10-CM | POA: Insufficient documentation

## 2013-06-17 DIAGNOSIS — IMO0001 Reserved for inherently not codable concepts without codable children: Secondary | ICD-10-CM | POA: Insufficient documentation

## 2013-06-20 ENCOUNTER — Encounter: Payer: Self-pay | Admitting: *Deleted

## 2013-06-23 ENCOUNTER — Ambulatory Visit (INDEPENDENT_AMBULATORY_CARE_PROVIDER_SITE_OTHER): Payer: Managed Care, Other (non HMO) | Admitting: Family Medicine

## 2013-06-23 ENCOUNTER — Encounter: Payer: Self-pay | Admitting: Family Medicine

## 2013-06-23 VITALS — BP 120/86 | Ht 70.5 in | Wt 187.0 lb

## 2013-06-23 DIAGNOSIS — H8120 Vestibular neuronitis, unspecified ear: Secondary | ICD-10-CM

## 2013-06-23 DIAGNOSIS — Z029 Encounter for administrative examinations, unspecified: Secondary | ICD-10-CM

## 2013-06-23 MED ORDER — LORAZEPAM 1 MG PO TABS: ORAL_TABLET | ORAL | Status: DC

## 2013-06-23 NOTE — Progress Notes (Signed)
  Subjective:    Patient ID: Kent Pearson, male    DOB: May 18, 1971, 42 y.o.   MRN: [id number]  HPI Patient is here for a follow up visit on vertigo/dizziness. He states he is currently on short term disability due to his symptoms. He needs a refill on his Ativan and states he has no other concerns at this time.   Did neurological studies. All came back normal. Though that the viral infxn may have stimulated a case of vistibular neuritis. ,has worked thru [employer] therapy with Systems developer. PT set up for three wks. f u visit not scheduled  Dealing with unsteadiness at times   Unfortunately ongoing dizziness. States he is unable to work at all. We suddenly strike him. He'll had bouts of nausea with it. Unsteadiness. He's afraid to drive for long distances.   Doing vigorous activity around the house with some success but other times triggering dizziness. Review of Systems No chest pain no back pain no abdominal pain ROS otherwise negative    Objective:   Physical Exam  Alert HEENT normal. Lungs clear. Heart regular in rhythm. Cerebellar function intact. No nystatin meds.      Assessment & Plan:  Impression 1 Vertigo with vestibular neuritis. Dizziness not better, unable to do current activities. Patient in the midst of this to do her training via physical therapist under neurologist direction. Plan work excuse given recheck in several weeks. Walking encouraged to when possible. Ativan refill. Side effects benefits discussed. WSL

## 2013-06-24 ENCOUNTER — Ambulatory Visit: Payer: Managed Care, Other (non HMO) | Admitting: Rehabilitative and Restorative Service Providers"

## 2013-07-01 ENCOUNTER — Ambulatory Visit: Payer: Managed Care, Other (non HMO) | Admitting: Rehabilitative and Restorative Service Providers"

## 2013-07-15 ENCOUNTER — Ambulatory Visit
Payer: Managed Care, Other (non HMO) | Attending: Neurology | Admitting: Rehabilitative and Restorative Service Providers"

## 2013-07-15 DIAGNOSIS — R42 Dizziness and giddiness: Secondary | ICD-10-CM | POA: Insufficient documentation

## 2013-07-15 DIAGNOSIS — IMO0001 Reserved for inherently not codable concepts without codable children: Secondary | ICD-10-CM | POA: Insufficient documentation

## 2013-07-21 ENCOUNTER — Other Ambulatory Visit: Payer: Self-pay | Admitting: Family Medicine

## 2013-07-21 ENCOUNTER — Telehealth: Payer: Self-pay | Admitting: Family Medicine

## 2013-07-21 NOTE — Telephone Encounter (Signed)
NTSW I never discuss disability issues over the phone--invariably a very long discussion. You can tell him our notes and consultants' notes do not reflect that at all. He can sched appt to discuss if he'd like to talk dierectly with me on this

## 2013-07-21 NOTE — Telephone Encounter (Signed)
Ps due for f u visit anyway

## 2013-07-21 NOTE — Telephone Encounter (Signed)
Patient says that his claim for short term disability has been dropped due to his paperwork saying his only issue was car sickness. He needs someone to call him back. I told him I would let Alcario Drought know, but he says no to send to Dr Brett Canales to handle.

## 2013-07-22 ENCOUNTER — Ambulatory Visit: Payer: Managed Care, Other (non HMO) | Admitting: Rehabilitative and Restorative Service Providers"

## 2013-07-22 ENCOUNTER — Telehealth: Payer: Self-pay | Admitting: Family Medicine

## 2013-07-22 NOTE — Telephone Encounter (Signed)
Patient wants to come in today or tomorrow to talk about his disability claim. He was not satisfied with the next available which is on 07/29/13. He says he is not getting paid and will be broke if he waits that long to come in.

## 2013-07-22 NOTE — Telephone Encounter (Signed)
Notified patient. I transferred him up front to make an appt w/ Dr. Steve.  

## 2013-07-22 NOTE — Telephone Encounter (Signed)
Ov tom 

## 2013-07-22 NOTE — Telephone Encounter (Signed)
Notified patient and transferred up front for appt.

## 2013-07-22 NOTE — Telephone Encounter (Signed)
Notified patient. I transferred him up front to make an appt w/ Dr. Brett Canales.

## 2013-07-23 ENCOUNTER — Ambulatory Visit (INDEPENDENT_AMBULATORY_CARE_PROVIDER_SITE_OTHER): Payer: Managed Care, Other (non HMO) | Admitting: Family Medicine

## 2013-07-23 ENCOUNTER — Ambulatory Visit: Payer: Managed Care, Other (non HMO) | Admitting: Family Medicine

## 2013-07-23 ENCOUNTER — Encounter: Payer: Self-pay | Admitting: Family Medicine

## 2013-07-23 VITALS — BP 128/90 | Ht 70.0 in | Wt 185.1 lb

## 2013-07-23 DIAGNOSIS — H8120 Vestibular neuronitis, unspecified ear: Secondary | ICD-10-CM

## 2013-07-23 NOTE — Progress Notes (Signed)
   Subjective:    Patient ID: Kent Pearson, male    DOB: [birth year], 42 y.o.   MRN: [id number]  HPI Patient is here to follow up visit on dizziness. He is here to discuss his disability paperwork. The wrong diagnose was put on his original paperwork and now his disability has been cancelled. He will not receive payment for this month and may have to pay back previous payments. He needs this corrected by our office immediately.   When looking at the computer, pt develops severe dizziness. Unable to look at screen for more than just a few minutes without getting very sixk Pt is driving, but only short distances and around town. Developing severe nausea. Patient states he absolutely cannot participate in any kind of a sustained activities on the computer. This is required for his work. He becomes violently nauseated. Extremely dizzy unsteady.  Taking the generic ativan 1 mg at bed and two half pills during day. Now off meclizine didn't help and dr Christain [place] side could hurt.  Neurologist set up with the rehab, seems to be helping some, but not enough yet,  [place] drive [place],  Review of Systems No true loss of consciousness no seizures no abdominal pain no change in bowel habits no shortness of breath ROS otherwise negative    Objective:   Physical Exam   Alert mild malaise. HEENT normal. Lungs clear. Heart regular in rhythm. Neuro exam intact.     Assessment & Plan:  Impression vestibular neuritis with severe disability at this point. Unable to look at computer screen for sustained periods. Unable to drive except for extremely short distances. In the midst of therapy via the physical therapist. This was initiated by patient's neurologist. Plan encouraged to get back with neurologist. I advised patient that I'm going to let the neurologist on with his major management at this point. He definitely should qualify for disability at this time to to his severe symptomatology. This is also  discussed. 35-40 minutes spent with patient most in discussion. WSL

## 2013-07-24 ENCOUNTER — Telehealth: Payer: Self-pay | Admitting: Family Medicine

## 2013-07-24 ENCOUNTER — Other Ambulatory Visit: Payer: Self-pay

## 2013-07-24 NOTE — Telephone Encounter (Signed)
Pt stated that you mentioned to him at his last office visit you were going to get some information together to have sent to his short term disability folks. Not sure if you were going to do a letter or something.  Please advise, I told pt that I would call him Mon 12/15 or Tues 12/16 with an update.  I also explained that [employer] handles those types of forms.

## 2013-07-24 NOTE — Telephone Encounter (Signed)
Medication faxed to pharmacy. Patient was notified.  

## 2013-07-24 NOTE — Telephone Encounter (Signed)
Patient needs Rx for ativan   [facility]

## 2013-07-26 NOTE — Telephone Encounter (Signed)
No, I advised him I would dictate a very thorough note stating why he cannot work at this time, and that we would be happy to fill out any further forms hes gets to [place] thru his insurance company, and, sine this is turning into a at least a medium term disability from dizziness it would be in his best interest to follow up exclusively with the neurologist because they will stand a better chance of carrying more weight with his insurance company going forward (I did not sched follow up)

## 2013-07-28 NOTE — Telephone Encounter (Signed)
[facility]

## 2013-07-29 ENCOUNTER — Ambulatory Visit: Payer: Managed Care, Other (non HMO) | Admitting: Rehabilitative and Restorative Service Providers"

## 2013-07-30 ENCOUNTER — Telehealth: Payer: Self-pay | Admitting: Neurology

## 2013-07-30 NOTE — Telephone Encounter (Signed)
Pt canc Monday 08/03/13 follow up appt w/ Dr. Everlena Cooper. He will call if follow up needed / Sherri S.

## 2013-07-30 NOTE — Telephone Encounter (Signed)
Spoke with pt 07/29/13 around 3:45.  Explained that Dr. Brett Canales will do thorough note for patient.  He is appreciative, let him know when ready.  FYI - Patient states that one day he went without the Ativan due to some refill confusion and he experienced some withdrawal symptoms. He's now weaning himself off the Ativan.  States that he feels better about the time the next dose is due.  States he's cutting pill and taking 1/4 in the morning and 1/2 in the evening about 12 hours apart.    Patient will use the note to appeal the short term disability denial.  Will let us know if there's anything we can do or any new forms to complete.  I also explained to pt to further follow up with this issue with the neurologist due to the complexity of the problem

## 2013-07-30 NOTE — Telephone Encounter (Signed)
brend the note i meant was the progress note ict in extreme detail to help support disability claim already done

## 2013-07-30 NOTE — Telephone Encounter (Signed)
brendale the note i mreant was the progress note, already done and very detailed to help bolster disability status

## 2013-08-03 ENCOUNTER — Ambulatory Visit: Payer: Managed Care, Other (non HMO) | Admitting: Neurology

## 2013-08-04 ENCOUNTER — Ambulatory Visit: Payer: Managed Care, Other (non HMO) | Admitting: Rehabilitative and Restorative Service Providers"

## 2013-08-17 NOTE — Telephone Encounter (Signed)
Something is missing in this encounter and not giving me access to close this encounter. Please fix and close encounter. °

## 2013-08-17 NOTE — Telephone Encounter (Signed)
tis is a bit weird, can you all tell me date because i have nothing in the pt chart list on this??

## 2013-08-17 NOTE — Telephone Encounter (Signed)
Will not let me close either said that doctor has unfinished note and must be closed by dr.

## 2013-08-20 ENCOUNTER — Telehealth: Payer: Self-pay | Admitting: Family Medicine

## 2013-08-20 NOTE — Telephone Encounter (Signed)
Ok write it, plus copylast note

## 2013-08-20 NOTE — Telephone Encounter (Signed)
Pt requesting a return to work note for 08/14/13, states he went back to work, is not 100% symptom free, but is able to work some now.  Also needs a copy of his last office note so that he can turn it into his disability insurance folks

## 2013-08-21 ENCOUNTER — Encounter: Payer: Self-pay | Admitting: Family Medicine

## 2013-08-25 ENCOUNTER — Ambulatory Visit
Payer: Managed Care, Other (non HMO) | Attending: Neurology | Admitting: Rehabilitative and Restorative Service Providers"

## 2013-08-25 DIAGNOSIS — R42 Dizziness and giddiness: Secondary | ICD-10-CM | POA: Insufficient documentation

## 2013-08-25 DIAGNOSIS — IMO0001 Reserved for inherently not codable concepts without codable children: Secondary | ICD-10-CM | POA: Insufficient documentation

## 2013-08-26 ENCOUNTER — Ambulatory Visit: Payer: Managed Care, Other (non HMO) | Admitting: Gastroenterology

## 2013-10-29 ENCOUNTER — Telehealth: Payer: Self-pay | Admitting: Neurology

## 2013-10-29 NOTE — Telephone Encounter (Signed)
Patient called today stating he is having problems again they started this past Saturday with the vertigo like feeling . He states for a few months he has done good he has been able to work a full 8 hours . He has weaned off of the 1/4 tab Ativan and is wondering if this might be the cause of his symptoms appearing again ? He is also asking if valium might be abetter choice ? Please  Advise

## 2013-10-29 NOTE — Telephone Encounter (Signed)
Pt is requesting to speak to a nurse regarding his condition. He states he is not sure if his sinuses are acting up or if it could be something else.

## 2013-11-09 ENCOUNTER — Encounter: Payer: Self-pay | Admitting: Neurology

## 2013-11-09 ENCOUNTER — Ambulatory Visit (INDEPENDENT_AMBULATORY_CARE_PROVIDER_SITE_OTHER): Payer: Managed Care, Other (non HMO) | Admitting: Neurology

## 2013-11-09 VITALS — BP 132/74 | HR 70 | Temp 97.5°F | Resp 16 | Ht 70.5 in | Wt 178.2 lb

## 2013-11-09 DIAGNOSIS — H832X9 Labyrinthine dysfunction, unspecified ear: Secondary | ICD-10-CM

## 2013-11-09 MED ORDER — LORAZEPAM 0.5 MG PO TABS: 0.5000 mg | ORAL_TABLET | Freq: Three times a day (TID) | ORAL | Status: DC | PRN

## 2013-11-09 NOTE — Progress Notes (Signed)
NEUROLOGY FOLLOW UP OFFICE NOTE  Kent Pearson [id number]  HISTORY OF PRESENT ILLNESS: Kent Pearson is a 43 year old right-handed man with history of borderline hyperglycemia, borderline high blood pressure, GERD, gallbladder removal, eye surgery and collapsed lungs due to salmonella who follows up for dizziness.  He is accompanied by his wife.  Records and images were personally reviewed where available.    UPDATE: He began using the Ativan as needed, which was helpful.  He would use either 0.25mg  to 0.5mg  (1mg  was too strong).  He also went to vestibular rehab where he was taught exercises.  He began performing the exercises regularly at home.  The symptoms improved over the course of January.  He was virtually symptom-free in February.  He eventually tapered off the Ativan and stopped the vestibular exercises.  About two weeks ago, he began experiencing sinus pressure in his face.  He started having the dizzy spells again (a sensation of movement but not spinning), which would occur when reading or moving his head.  This time, he also noted aural fullness and tinnitus in the left ear.  No hearing loss.  He felt some nausea.  He's not sure what triggered it, but it may have been playing video games with his children.  He began taking the Ativan (about 0.25mg ) sparingly, mostly if he has to drive.  He resumed the exercises and feels that the symptoms have improved over the past few days, although not yet resolved.  HISTORY: Onset of symptoms started about 2 months ago.  He developed a bad stomach flu where he felt nauseous and weak for 5 days.  Symptoms began soon afterward.  He first noticed feeling of motion sickness while driving to work.  This slowly progressed where he was unable to tolerate driving.  He describes feeling of movement, either of his eyes or the room.  Sometimes it is a spinning sensation but not usually.  It is associated with nausea.  On one occasion he noted numbness in his  hands and fingers.  He also feels slightly unsteady on his feet.  It usually lasts a few minutes.  It can be triggered by change in position, change in head movement or scanning with his eyes (such as looking at the computer or reading).  It is not associated with headache, vomiting, tinnitus, ear fullness,hearing loss, unilateral facial numbness, dysarthria or dysphagia.  He felt that symptoms have been a little better over the past week, but overall no change. At first, he thought it may have been secondary to omeprazole, but episodes persisted after he stopped.  He started taking Meclizine, which helped with the symptoms.  He does occasionally get non-throbbing bi-frontal headaches (no nausea) which seem consistent with tension-type headaches.  He was evaluated by ENT.  Exam revealed negative Dix-Hallpike.  There was no nystagmus with pneumatic pressure on either tympanic membrane or Valsalva but did become dizzy when moving from supine to upright position.    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  The speech reception threshold is 15dB AD and 15dB AS.  The discrimination score is 96% AD and 96% AS.  The tympanogram is normal bilaterally.  He was advised to discontinue Meclizine and try Ativan as needed  06/09/13 MRI BRAIN W/WO:  normal.  PAST MEDICAL HISTORY: Past Medical History  Diagnosis Date  . Helicobacter pylori gastritis     18+ years ago per pt report  . Acid reflux     MEDICATIONS: No current  outpatient prescriptions on file prior to visit.   No current facility-administered medications on file prior to visit.    ALLERGIES: Allergies  Allergen Reactions  . Augmentin [Amoxicillin-Pot Clavulanate] Nausea And Vomiting    Nausea and vomiting    FAMILY HISTORY: Family History  Problem Relation Age of Onset  . Colon cancer Neg Hx   . Ataxia Neg Hx   . [place] Neg Hx   . Dementia Neg Hx   . Mental retardation Neg Hx   . Migraines Neg Hx     . Multiple sclerosis Neg Hx   . Neurofibromatosis Neg Hx   . Neuropathy Neg Hx   . Parkinsonism Neg Hx   . Seizures Neg Hx   . Stroke Neg Hx     SOCIAL HISTORY: History   Social History  . Marital Status: Married    Spouse Name: N/A    Number of Children: N/A  . Years of Education: N/A   Occupational History  . Not on file.   Social History Main Topics  . Smoking status: Never Smoker   . Smokeless tobacco: Not on file  . Alcohol Use: No  . Drug Use: No  . Sexual Activity: Not on file   Other Topics Concern  . Not on file   Social History Narrative  . No narrative on file    REVIEW OF SYSTEMS: Constitutional: No fevers, chills, or sweats, no generalized fatigue, change in appetite Eyes: No visual changes, double vision, eye pain Ear, nose and throat: Tinnitus, aural fullness Cardiovascular: No chest pain, palpitations Respiratory:  No shortness of breath at rest or with exertion, wheezes GastrointestinaI: Mild nausea.  No vomiting, diarrhea, abdominal pain, fecal incontinence Genitourinary:  No dysuria, urinary retention or frequency Musculoskeletal:  No neck pain, back pain Integumentary: No rash, pruritus, skin lesions Neurological: as above Psychiatric: No depression, insomnia, anxiety Endocrine: No palpitations, fatigue, diaphoresis, mood swings, change in appetite, change in weight, increased thirst Hematologic/Lymphatic:  No anemia, purpura, petechiae. Allergic/Immunologic: no itchy/runny eyes, nasal congestion, recent allergic reactions, rashes  PHYSICAL EXAM: Filed Vitals:   11/09/13 1358  BP: 132/74  Pulse: 70  Temp: 97.5 F (36.4 C)  Resp: 16   General: No acute distress Head:  Normocephalic/atraumatic Neck: supple, no paraspinal tenderness, full range of motion Heart:  Regular rate and rhythm Lungs:  Clear to auscultation bilaterally Back: No paraspinal tenderness Neurological Exam: alert and oriented to person, place, and time. Attention  span and concentration intact, recent and remote memory intact, fund of knowledge intact.  Speech fluent and not dysarthric, language intact.  CN II-XII intact. Fundoscopic exam unremarkable without vessel changes, exudates, hemorrhages or papilledema.  Bulk and tone normal, muscle strength 5/5 throughout.  Sensation to light touch, temperature and vibration intact.  Deep tendon reflexes 2+ throughout, toes downgoing.  Finger to nose and heel to shin testing intact.  Gait normal, Romberg negative.  IMPRESSION: Vestibular dysfunction, possible vestibular neuritis, exacerbated.  Hopefully symptoms will continue to improve.  PLAN: 1.  Ativan 0.25 to 0.5mg  sparingly for severe symptoms. 2.  Continue vestibular exercises 3.  Follow up in 6 weeks.  Shon Millet, DO  CC:  Ardyth Gal, MD

## 2013-11-09 NOTE — Patient Instructions (Signed)
1.  I gave you a prescription for ativan 0.5mg  tablets.  Take 1/2 to 1 tablet as needed up to 3 times a day.  Take sparingly. 2.  Continue the exercises 3.  Follow up in 6 weeks.

## 2013-12-21 ENCOUNTER — Ambulatory Visit: Payer: Managed Care, Other (non HMO) | Admitting: Neurology

## 2014-09-01 ENCOUNTER — Ambulatory Visit (INDEPENDENT_AMBULATORY_CARE_PROVIDER_SITE_OTHER): Payer: BLUE CROSS/BLUE SHIELD | Admitting: Family Medicine

## 2014-09-01 ENCOUNTER — Encounter: Payer: Self-pay | Admitting: Family Medicine

## 2014-09-01 ENCOUNTER — Telehealth: Payer: Self-pay | Admitting: Family Medicine

## 2014-09-01 VITALS — BP 120/84 | Temp 98.3°F | Ht 70.0 in | Wt 184.1 lb

## 2014-09-01 DIAGNOSIS — J329 Chronic sinusitis, unspecified: Secondary | ICD-10-CM

## 2014-09-01 DIAGNOSIS — J310 Chronic rhinitis: Secondary | ICD-10-CM

## 2014-09-01 MED ORDER — AZITHROMYCIN 250 MG PO TABS: ORAL_TABLET | ORAL | Status: DC

## 2014-09-01 NOTE — Telephone Encounter (Signed)
Pt is wanting to know if you will call in an antibiotic for him   His son had some issues around the end of dec, then 08/20/14 antibiotics Were called in for him for his URI,   He states he is having the same issues  And would like a zpak  Symptoms sinus pressure/drainage post four days of fever   [facility]

## 2014-09-01 NOTE — Progress Notes (Signed)
   Subjective:    Patient ID: Kent Pearson, male    DOB: 12/08/1970, 44 y.o.   MRN: [id number]  Sinusitis This is a new problem. The current episode started 1 to 4 weeks ago. The problem is unchanged. There has been no fever. The pain is moderate. Associated symptoms include congestion, coughing, headaches and sinus pressure. (Drainage) Past treatments include oral decongestants. The treatment provided no relief.   Patient states that he has no other concerns at this time.   Cong and drainage and cough in the kids  Started with  Settled into the voice  coungestion frontal headache  Drainage and cong   Pressure some  Cough lingering here and ther Review of Systems  HENT: Positive for congestion and sinus pressure.   Respiratory: Positive for cough.   Neurological: Positive for headaches.       Objective:   Physical Exam  Alert moderate malaise. Nasal congestion. TMs retracted pharynx normal neck supple. Lungs clear heart regular in rhythm.      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 Since Medicare discussed. WSL

## 2014-09-01 NOTE — Telephone Encounter (Signed)
Transferred up front to schedule an OV for today.

## 2015-03-30 ENCOUNTER — Telehealth: Payer: Self-pay | Admitting: Family Medicine

## 2015-03-30 NOTE — Telephone Encounter (Signed)
These are usually done in the context of a wellness exam, put in a regular slot, but not on same day of (I guess his spouse?) his spouse.  Give appt to carolyn for his spouse? If she has a gyn that does her wellnesses, than we have to fine a dx to code it for  Her not as a wellness since insurance co dont do both

## 2015-03-30 NOTE — Telephone Encounter (Signed)
Patient is in need of having a biometric screening done for his job.  He said that it basically consists of height, weight, having his vitals checked and blood work.  He needs this done by the end of August.  Dr. Soyla Dryer schedule and Carolyn's are completely booked for the rest of the month.  Patient wants to know if he can be worked in somewhere for this before the end of August.

## 2015-05-04 ENCOUNTER — Telehealth: Payer: Self-pay | Admitting: Neurology

## 2015-05-04 NOTE — Telephone Encounter (Signed)
Pt called in to request a refill of Med

## 2015-05-04 NOTE — Telephone Encounter (Signed)
Pt called in for a refill of Lorazepam/ call back @ [phone]

## 2015-05-05 NOTE — Telephone Encounter (Signed)
Spoke with pt. Pt. Advised that we could not provide further refills since he has not been seen in 1.5 years, I explained that he would need to obtain this medication through his primary care provider. He seemed upset on the phone and then he hung up on me. Will call back with further questions and concerns. [facility]

## 2015-05-05 NOTE — Telephone Encounter (Signed)
It's been a year and a half, so I would not refill it.  If the patient still uses it, then it should be addressed with his PCP for further refills.

## 2015-05-05 NOTE — Telephone Encounter (Signed)
Dr. Everlena Cooper, we have not seen this patient since 10/2013. I didn't know if you wanted to refill medication.

## 2015-05-09 ENCOUNTER — Telehealth: Payer: Self-pay | Admitting: Family Medicine

## 2015-05-09 MED ORDER — LORAZEPAM 0.5 MG PO TABS: 0.5000 mg | ORAL_TABLET | Freq: Three times a day (TID) | ORAL | Status: DC | PRN

## 2015-05-09 NOTE — Telephone Encounter (Signed)
Rx faxed to pharmacy. Patient notified. 

## 2015-05-09 NOTE — Telephone Encounter (Signed)
This is Dr. Soyla Dryer patient. Best for him to make that decision thank you last seen January 2016

## 2015-05-09 NOTE — Telephone Encounter (Signed)
Ok times one 

## 2015-05-09 NOTE — Telephone Encounter (Signed)
Patient requesting Rx for LORazepam (ATIVAN) 0.5 MG tablet    [address]

## 2015-05-11 ENCOUNTER — Emergency Department (HOSPITAL_COMMUNITY): Payer: BLUE CROSS/BLUE SHIELD

## 2015-05-11 ENCOUNTER — Emergency Department (HOSPITAL_COMMUNITY)
Admission: EM | Admit: 2015-05-11 | Discharge: 2015-05-12 | Disposition: A | Discharge status: Home / Self Care | Payer: BLUE CROSS/BLUE SHIELD | Attending: Emergency Medicine | Admitting: Emergency Medicine

## 2015-05-11 ENCOUNTER — Encounter (HOSPITAL_COMMUNITY): Payer: Self-pay | Admitting: *Deleted

## 2015-05-11 DIAGNOSIS — Z79899 Other long term (current) drug therapy: Secondary | ICD-10-CM | POA: Diagnosis not present

## 2015-05-11 DIAGNOSIS — R109 Unspecified abdominal pain: Secondary | ICD-10-CM

## 2015-05-11 DIAGNOSIS — N200 Calculus of kidney: Secondary | ICD-10-CM | POA: Diagnosis not present

## 2015-05-11 DIAGNOSIS — Z8619 Personal history of other infectious and parasitic diseases: Secondary | ICD-10-CM | POA: Diagnosis not present

## 2015-05-11 DIAGNOSIS — K219 Gastro-esophageal reflux disease without esophagitis: Secondary | ICD-10-CM | POA: Insufficient documentation

## 2015-05-11 LAB — URINE MICROSCOPIC-ADD ON

## 2015-05-11 LAB — CBC WITH DIFFERENTIAL/PLATELET
Basophils Absolute: 0 10*3/uL (ref 0.0–0.1)
Basophils Relative: 1 %
EOS PCT: 2 %
Eosinophils Absolute: 0.1 10*3/uL (ref 0.0–0.7)
HEMATOCRIT: 39.9 % (ref 39.0–52.0)
Hemoglobin: 14.4 g/dL (ref 13.0–17.0)
LYMPHS PCT: 48 %
Lymphs Abs: 3 10*3/uL (ref 0.7–4.0)
MCH: 29.9 pg (ref 26.0–34.0)
MCHC: 36.1 g/dL — ABNORMAL HIGH (ref 30.0–36.0)
MCV: 82.8 fL (ref 78.0–100.0)
MONO ABS: 0.5 10*3/uL (ref 0.1–1.0)
Monocytes Relative: 8 %
Neutro Abs: 2.6 10*3/uL (ref 1.7–7.7)
Neutrophils Relative %: 41 %
Platelets: 265 10*3/uL (ref 150–400)
RBC: 4.82 MIL/uL (ref 4.22–5.81)
RDW: 12.6 % (ref 11.5–15.5)
WBC: 6.3 10*3/uL (ref 4.0–10.5)

## 2015-05-11 LAB — COMPREHENSIVE METABOLIC PANEL
ALK PHOS: 85 U/L (ref 38–126)
ALT: 33 U/L (ref 17–63)
AST: 31 U/L (ref 15–41)
Albumin: 4.4 g/dL (ref 3.5–5.0)
Anion gap: 12 (ref 5–15)
BUN: 18 mg/dL (ref 6–20)
CALCIUM: 9 mg/dL (ref 8.9–10.3)
CO2: 25 mmol/L (ref 22–32)
CREATININE: 1.01 mg/dL (ref 0.61–1.24)
Chloride: 101 mmol/L (ref 101–111)
GFR calc Af Amer: >60 mL/min (ref >60)
GFR calc non Af Amer: >60 mL/min (ref >60)
GLUCOSE: 142 mg/dL — AB (ref 65–99)
Potassium: 3.1 mmol/L — ABNORMAL LOW (ref 3.5–5.1)
SODIUM: 138 mmol/L (ref 135–145)
Total Bilirubin: 0.4 mg/dL (ref 0.3–1.2)
Total Protein: 7.6 g/dL (ref 6.5–8.1)

## 2015-05-11 LAB — URINALYSIS, ROUTINE W REFLEX MICROSCOPIC
Bilirubin Urine: NEGATIVE
GLUCOSE, UA: NEGATIVE mg/dL
Ketones, ur: NEGATIVE mg/dL
LEUKOCYTES UA: NEGATIVE
NITRITE: NEGATIVE
PH: 7 (ref 5.0–8.0)
SPECIFIC GRAVITY, URINE: 1.02 (ref 1.005–1.030)
Urobilinogen, UA: 0.2 mg/dL (ref 0.0–1.0)

## 2015-05-11 LAB — I-STAT CG4 LACTIC ACID, ED
LACTIC ACID, VENOUS: 1.95 mmol/L (ref 0.5–2.0)
Lactic Acid, Venous: 4.01 mmol/L (ref 0.5–2.0)
Lactic Acid, Venous: 4.33 mmol/L (ref 0.5–2.0)

## 2015-05-11 MED ORDER — SODIUM CHLORIDE 0.9 % IV SOLN: 1000.0000 mL | INTRAVENOUS | Status: DC

## 2015-05-11 MED ORDER — IBUPROFEN 800 MG PO TABS: 800.0000 mg | ORAL_TABLET | Freq: Three times a day (TID) | ORAL | Status: DC

## 2015-05-11 MED ORDER — OXYCODONE-ACETAMINOPHEN 5-325 MG PO TABS: 1.0000 | ORAL_TABLET | ORAL | Status: DC | PRN

## 2015-05-11 MED ORDER — ONDANSETRON HCL 4 MG PO TABS: 4.0000 mg | ORAL_TABLET | Freq: Four times a day (QID) | ORAL | Status: DC

## 2015-05-11 MED ORDER — ONDANSETRON HCL 4 MG/2ML IJ SOLN
4.0000 mg | Freq: Once | INTRAMUSCULAR | Status: DC
  Filled 2015-05-11: qty 2

## 2015-05-11 MED ORDER — SODIUM CHLORIDE 0.9 % IV SOLN
1000.0000 mL | Freq: Once | INTRAVENOUS | Status: AC
  Administered 2015-05-11: 1000 mL via INTRAVENOUS

## 2015-05-11 MED ORDER — KETOROLAC TROMETHAMINE 30 MG/ML IJ SOLN
INTRAMUSCULAR | Status: AC
  Filled 2015-05-11: qty 1

## 2015-05-11 MED ORDER — KETOROLAC TROMETHAMINE 30 MG/ML IJ SOLN
30.0000 mg | Freq: Once | INTRAMUSCULAR | Status: AC
  Administered 2015-05-11: 30 mg via INTRAVENOUS

## 2015-05-11 MED ORDER — ONDANSETRON HCL 4 MG/2ML IJ SOLN
4.0000 mg | Freq: Once | INTRAMUSCULAR | Status: AC
  Administered 2015-05-11: 4 mg via INTRAVENOUS

## 2015-05-11 MED ORDER — HYDROMORPHONE HCL 1 MG/ML IJ SOLN
1.0000 mg | Freq: Once | INTRAMUSCULAR | Status: AC
  Administered 2015-05-11: 1 mg via INTRAVENOUS

## 2015-05-11 MED ORDER — HYDROMORPHONE HCL 1 MG/ML IJ SOLN
1.0000 mg | Freq: Once | INTRAMUSCULAR | Status: AC
  Administered 2015-05-11: 1 mg via INTRAVENOUS
  Filled 2015-05-11: qty 1

## 2015-05-11 MED ORDER — SODIUM CHLORIDE 0.9 % IV BOLUS (SEPSIS)
1000.0000 mL | Freq: Once | INTRAVENOUS | Status: AC
  Administered 2015-05-11: 1000 mL via INTRAVENOUS

## 2015-05-11 MED ORDER — HYDROMORPHONE HCL 1 MG/ML IJ SOLN
1.0000 mg | Freq: Once | INTRAMUSCULAR | Status: DC
  Filled 2015-05-11: qty 1

## 2015-05-11 MED ORDER — POTASSIUM CHLORIDE CRYS ER 20 MEQ PO TBCR
40.0000 meq | EXTENDED_RELEASE_TABLET | Freq: Once | ORAL | Status: AC
  Administered 2015-05-11: 40 meq via ORAL
  Filled 2015-05-11: qty 2

## 2015-05-11 NOTE — ED Notes (Signed)
Pt c/o sudden onset of right flank pain with nausea,

## 2015-05-11 NOTE — Discharge Instructions (Signed)
Kidney Stones Followup with your doctor and urologist. Return to the ED if you develop new or worsening symptoms. Kidney stones (urolithiasis) are deposits that form inside your kidneys. The intense pain is caused by the stone moving through the urinary tract. When the stone moves, the ureter goes into spasm around the stone. The stone is usually passed in the urine.  CAUSES   A disorder that makes certain neck glands produce too much parathyroid hormone (primary hyperparathyroidism).  A buildup of uric acid crystals, similar to gout in your joints.  Narrowing (stricture) of the ureter.  A kidney obstruction present at birth (congenital obstruction).  Previous surgery on the kidney or ureters.  Numerous kidney infections. SYMPTOMS   Feeling sick to your stomach (nauseous).  Throwing up (vomiting).  Blood in the urine (hematuria).  Pain that usually spreads (radiates) to the groin.  Frequency or urgency of urination. DIAGNOSIS   Taking a history and physical exam.  Blood or urine tests.  CT scan.  Occasionally, an examination of the inside of the urinary bladder (cystoscopy) is performed. TREATMENT   Observation.  Increasing your fluid intake.  Extracorporeal shock wave lithotripsy--This is a noninvasive procedure that uses shock waves to break up kidney stones.  Surgery may be needed if you have severe pain or persistent obstruction. There are various surgical procedures. Most of the procedures are performed with the use of small instruments. Only small incisions are needed to accommodate these instruments, so recovery time is minimized. The size, location, and chemical composition are all important variables that will determine the proper choice of action for you. Talk to your health care provider to better understand your situation so that you will minimize the risk of injury to yourself and your kidney.  HOME CARE INSTRUCTIONS   Drink enough water and fluids to keep  your urine clear or pale yellow. This will help you to pass the stone or stone fragments.  Strain all urine through the provided strainer. Keep all particulate matter and stones for your health care provider to see. The stone causing the pain may be as small as a grain of salt. It is very important to use the strainer each and every time you pass your urine. The collection of your stone will allow your health care provider to analyze it and verify that a stone has actually passed. The stone analysis will often identify what you can do to reduce the incidence of recurrences.  Only take over-the-counter or prescription medicines for pain, discomfort, or fever as directed by your health care provider.  Make a follow-up appointment with your health care provider as directed.  Get follow-up X-rays if required. The absence of pain does not always mean that the stone has passed. It may have only stopped moving. If the urine remains completely obstructed, it can cause loss of kidney function or even complete destruction of the kidney. It is your responsibility to make sure X-rays and follow-ups are completed. Ultrasounds of the kidney can show blockages and the status of the kidney. Ultrasounds are not associated with any radiation and can be performed easily in a matter of minutes. SEEK MEDICAL CARE IF:  You experience pain that is progressive and unresponsive to any pain medicine you have been prescribed. SEEK IMMEDIATE MEDICAL CARE IF:   Pain cannot be controlled with the prescribed medicine.  You have a fever or shaking chills.  The severity or intensity of pain increases over 18 hours and is not relieved by pain  medicine.  You develop a new onset of abdominal pain.  You feel faint or pass out.  You are unable to urinate. MAKE SURE YOU:   Understand these instructions.  Will watch your condition.  Will get help right away if you are not doing well or get worse. Document Released:  07/30/2005 Document Revised: 04/01/2013 Document Reviewed: 12/31/2012 [facility] Patient Information 2015 [place], [place]. This information is not intended to replace advice given to you by your health care provider. Make sure you discuss any questions you have with your health care provider.

## 2015-05-11 NOTE — ED Provider Notes (Signed)
CSN: [id number]     Arrival date & time 05/11/15  2034 History  By signing my name below, I, Kent Pearson, attest that this documentation has been prepared under the direction and in the presence of Glynn Octave, MD. Electronically Signed: Budd Pearson, ED Scribe. 05/11/2015. 9:07 PM.    Chief Complaint  Patient presents with  . Flank Pain   The history is provided by the patient. No language interpreter was used.   HPI Comments: Kent Pearson is a 44 y.o. male who presents to the Emergency Department complaining of constant, aching, right-sided abdominal pain of sudden onset that began 1 hour ago while at rest. He reports associated nausea. He notes a PSHx of cholecystectomy. He denies a PMHx of kidney stones. Pt denies vomiting, testicular pain, hematuria, and back pain.  Pt is allergic to Augmentin.  Past Medical History  Diagnosis Date  . Helicobacter pylori gastritis     18+ years ago per pt report  . Acid reflux    Past Surgical History  Procedure Laterality Date  . Collapsed lung  1988    high school  . Eye surgery  1983    left  . Cholecystectomy  1993   Family History  Problem Relation Age of Onset  . Colon cancer Neg Hx   . Ataxia Neg Hx   . Chorea Neg Hx   . Dementia Neg Hx   . Mental retardation Neg Hx   . Migraines Neg Hx   . Multiple sclerosis Neg Hx   . Neurofibromatosis Neg Hx   . Neuropathy Neg Hx   . Parkinsonism Neg Hx   . Seizures Neg Hx   . Stroke Neg Hx    Social History  Substance Use Topics  . Smoking status: Never Smoker   . Smokeless tobacco: None  . Alcohol Use: No    Review of Systems A complete 10 system review of systems was obtained and all systems are negative except as noted in the HPI and PMH.   Allergies  Augmentin  Home Medications   Prior to Admission medications   Medication Sig Start Date End Date Taking? Authorizing Provider  loratadine (ALLERGY) 10 MG tablet Take 10 mg by mouth daily as needed for allergies  or itching.   Yes Historical Provider, MD  LORazepam (ATIVAN) 0.5 MG tablet Take 1 tablet (0.5 mg total) by mouth every 8 (eight) hours as needed for anxiety. 05/09/15  Yes Merlyn Albert, MD  omeprazole (PRILOSEC) 20 MG capsule Take 20 mg by mouth daily.   Yes Historical Provider, MD  ibuprofen (ADVIL,MOTRIN) 800 MG tablet Take 1 tablet (800 mg total) by mouth 3 (three) times daily. 05/11/15   Glynn Octave, MD  ibuprofen (ADVIL,MOTRIN) 800 MG tablet Take 1 tablet (800 mg total) by mouth 3 (three) times daily. 05/11/15   Glynn Octave, MD  ondansetron (ZOFRAN) 4 MG tablet Take 1 tablet (4 mg total) by mouth every 6 (six) hours. 05/11/15   Glynn Octave, MD  oxyCODONE-acetaminophen (PERCOCET/ROXICET) 5-325 MG tablet Take 1 tablet by mouth every 4 (four) hours as needed for severe pain. 05/11/15   Glynn Octave, MD   BP 117/70 mmHg  Pulse 84  Temp(Src) 97.8 F (36.6 C) (Oral)  Resp 18  Ht 5' 10.5" (1.791 m)  Wt 185 lb (83.915 kg)  BMI 26.16 kg/m2  SpO2 98% Physical Exam  Constitutional: He is oriented to person, place, and time. He appears well-developed and well-nourished.  Uncomfortable, writhing on the bed  HENT:  Head: Normocephalic and atraumatic.  Mouth/Throat: Oropharynx is clear and moist. No oropharyngeal exudate.  Eyes: Conjunctivae and EOM are normal. Pupils are equal, round, and reactive to light.  Neck: Normal range of motion. Neck supple.  No meningismus.  Cardiovascular: Normal rate, regular rhythm, normal heart sounds and intact distal pulses.   No murmur heard. Pulmonary/Chest: Effort normal and breath sounds normal. No respiratory distress.  Abdominal: Soft. There is tenderness. There is no rebound and no guarding.  R sided abdominal pain without guarding or rebound  Genitourinary:  no testicular pain  Musculoskeletal: Normal range of motion. He exhibits no edema or tenderness.  Neurological: He is alert and oriented to person, place, and time. No cranial nerve  deficit. He exhibits normal muscle tone. Coordination normal.  No ataxia on finger to nose bilaterally. No pronator drift. 5/5 strength throughout. CN 2-12 intact. Negative Romberg. Equal grip strength. Sensation intact. Gait is normal.   Skin: Skin is warm.  Psychiatric: He has a normal mood and affect. His behavior is normal.  Nursing note and vitals reviewed.   ED Course  Procedures  DIAGNOSTIC STUDIES: Oxygen Saturation is 100% on RA, normal by my interpretation.    COORDINATION OF CARE: 9:04 PM - Discussed plans to order pain medication, IV fluids, and anti-nausea medication, as well as diagnostic studies and imaging. Pt advised of plan for treatment and pt agrees.  Labs Review Labs Reviewed  COMPREHENSIVE METABOLIC PANEL - Abnormal; Notable for the following:    Potassium 3.1 (*)    Glucose, Bld 142 (*)    All other components within normal limits  CBC WITH DIFFERENTIAL/PLATELET - Abnormal; Notable for the following:    MCHC 36.1 (*)    All other components within normal limits  URINALYSIS, ROUTINE W REFLEX MICROSCOPIC (NOT AT [facility]) - Abnormal; Notable for the following:    Hgb urine dipstick LARGE (*)    Protein, ur TRACE (*)    All other components within normal limits  URINE MICROSCOPIC-ADD ON - Abnormal; Notable for the following:    Squamous Epithelial / LPF FEW (*)    Bacteria, UA FEW (*)    All other components within normal limits  I-STAT CG4 LACTIC ACID, ED - Abnormal; Notable for the following:    Lactic Acid, Venous 4.01 (*)    All other components within normal limits  I-STAT CG4 LACTIC ACID, ED - Abnormal; Notable for the following:    Lactic Acid, Venous 4.33 (*)    All other components within normal limits  I-STAT CG4 LACTIC ACID, ED  I-STAT CG4 LACTIC ACID, ED    Imaging Review Ct Renal Stone Study  05/11/2015   CLINICAL DATA:  Acute onset right flank pain today.  EXAM: CT ABDOMEN AND PELVIS WITHOUT CONTRAST  TECHNIQUE: Multidetector CT imaging of the  abdomen and pelvis was performed following the standard protocol without IV contrast.  COMPARISON:  None.  FINDINGS: Lower chest:  No acute findings.  Hepatobiliary: No mass visualized on this un-enhanced exam. Prior cholecystectomy noted. No evidence of biliary dilatation.  Pancreas: No mass or inflammatory process identified on this un-enhanced exam.  Spleen: Within normal limits in size.  Adrenals/Urinary Tract: Mild right hydronephrosis and ureterectasis is seen to the level the urinary bladder. A tiny 1 mm calculus is seen at the right ureterovesical junction on image 79. No other urinary calculi identified.  Stomach/Bowel: No evidence of obstruction, inflammatory process, or abnormal fluid collections.  Vascular/Lymphatic: No pathologically enlarged lymph nodes.  No evidence of abdominal aortic aneurysm.  Reproductive: No mass or other significant abnormality.  Other: None.  Musculoskeletal:  No suspicious bone lesions identified.  IMPRESSION: Mild right hydroureteronephrosis, with tiny 1 mm calculus at the right ureterovesical junction.   Electronically Signed   By: Myles Rosenthal M.D.   On: 05/11/2015 21:41   I have personally reviewed and evaluated these images and lab results as part of my medical decision-making.   EKG Interpretation None      MDM   Final diagnoses:  Flank pain  Kidney stone     acute onset of right-sided flank pain with nausea one hour ago. No history of kidney stone. No testicular pain or urinary symptoms.  lactate was inadvertently ordered and came back at 4. This was obtained when patient was extreme distress from his kidney stone.   CT shows mild right hydronephrosis with tiny 1 mm stone at the right UVJ.   repeat lactate normal 1.95. Potassium replaced.   CT scan shows small tiny right UVJ kidney stone.  Pain controlled in the ED. Patient tolerating by mouth. No vomiting. Follow up with urology and PCP. Return precautions discussed.  I personally performed the  services described in this documentation, which was scribed in my presence. The recorded information has been reviewed and is accurate.    Glynn Octave, MD 05/12/15 [id number]

## 2015-10-15 IMAGING — CT CT RENAL STONE PROTOCOL
3 of 4 series · 12 of 46 positions shown, 17 images · non-contrast
Comparison: None.

CLINICAL DATA: Acute onset right flank pain today.

EXAM:
CT ABDOMEN AND PELVIS WITHOUT CONTRAST
TECHNIQUE: Multidetector CT imaging of the abdomen and pelvis was performed
following the standard protocol without IV contrast.

[Series 3: mpr coronal (id) · coronal · 0.86mm/px · 3 of 102 slices shown]
[im 34/102  soft-tissue]
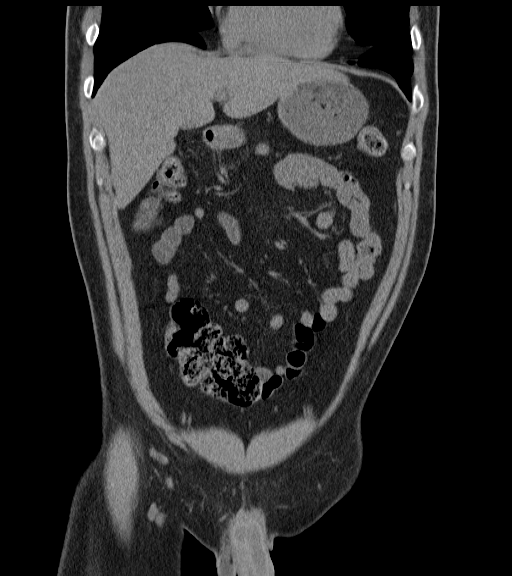
[im 45/102  soft-tissue]
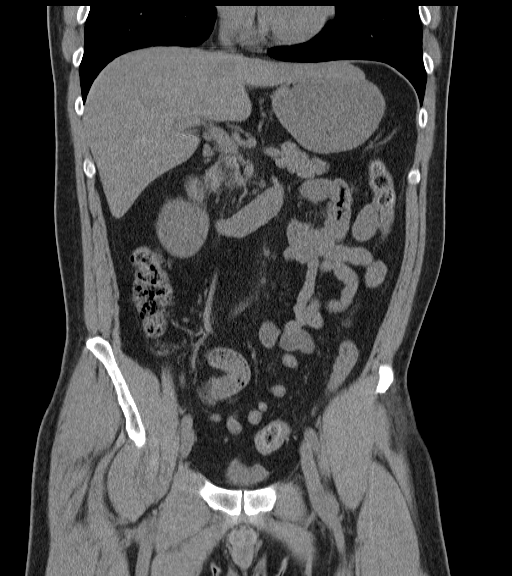
[im 57/102  soft-tissue]
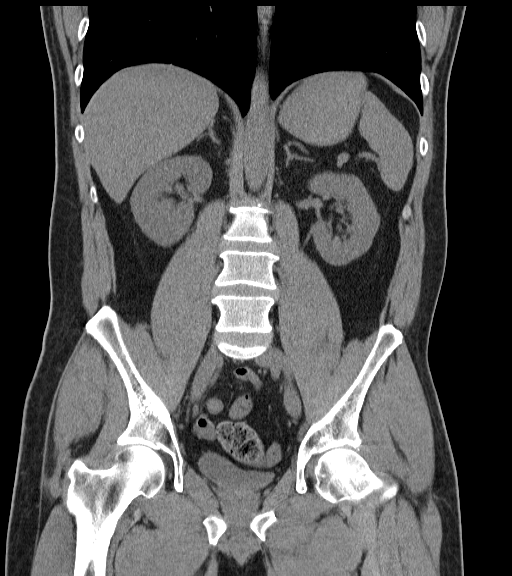

[Series 4: mpr sagittal (id) · sagittal · 0.65mm/px · 1 of 112 slices shown]
[im 38/112  soft-tissue]
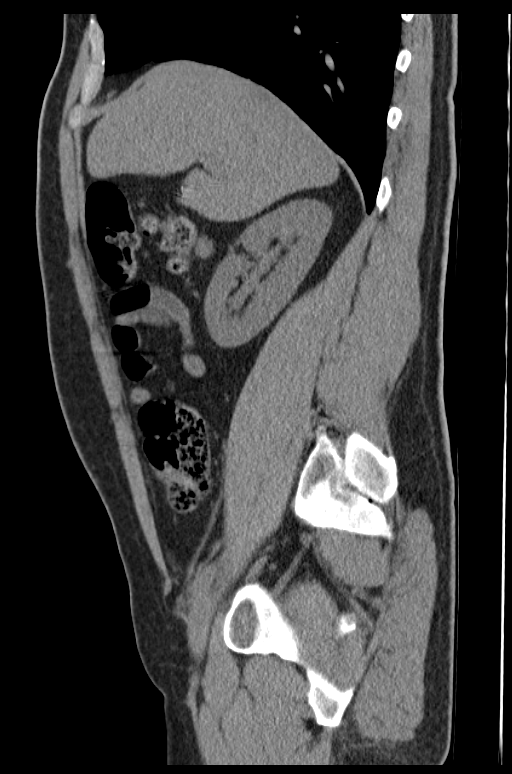

[Series 6: lung 5.0 b60f · axial · 0.66mm/px · z∈[-296,-126]mm · 8 of 44 slices shown, 13 images]
[im 5/44  soft-tissue]
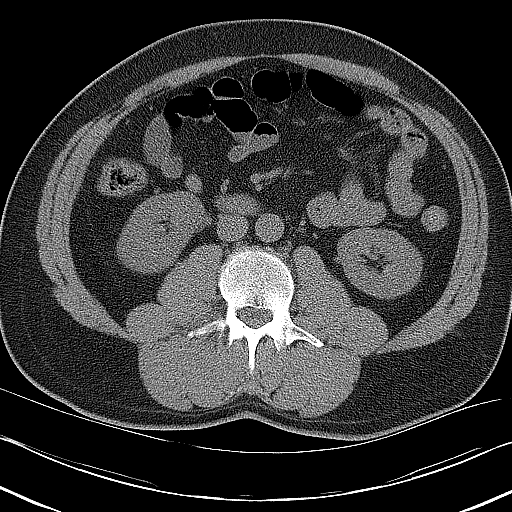
[im 5/44  bone]
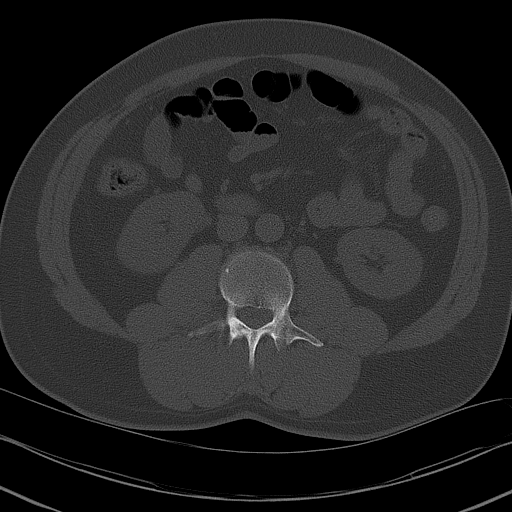
[im 10/44  soft-tissue]
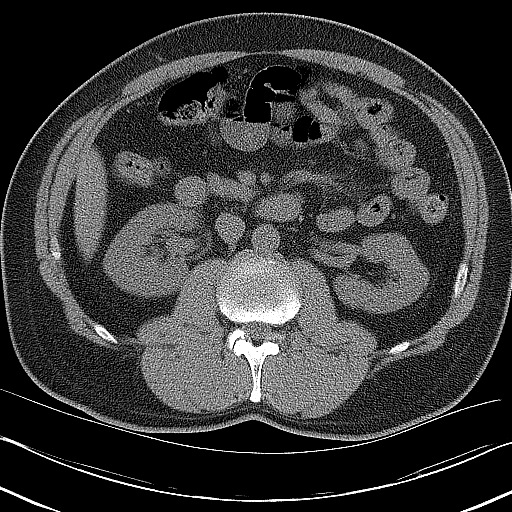
[im 15/44  soft-tissue]
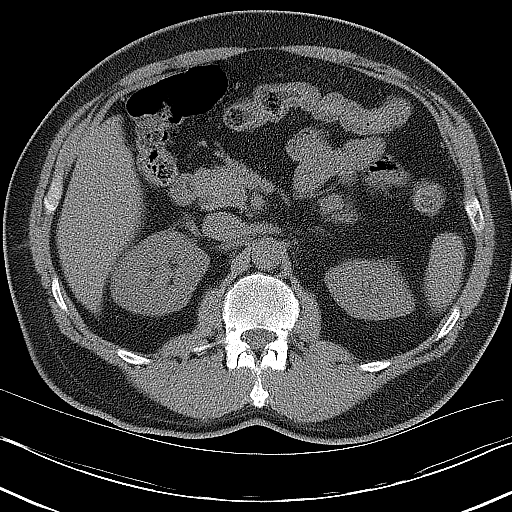
[im 20/44  soft-tissue]
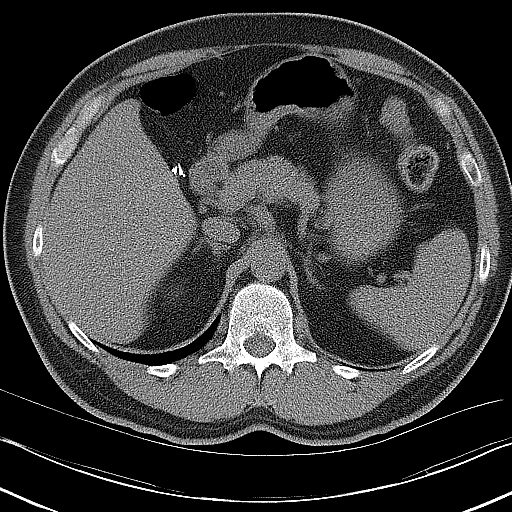
[im 24/44  soft-tissue]
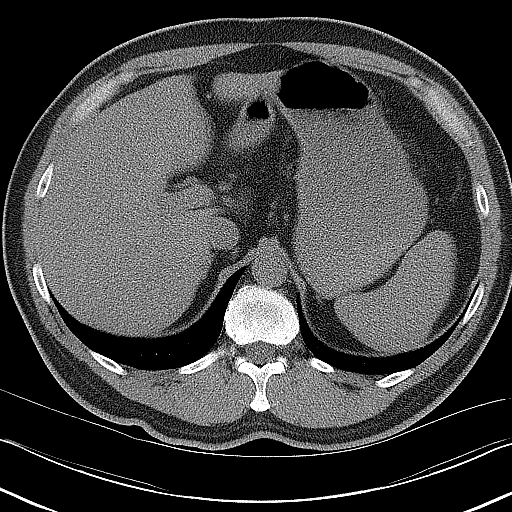
[im 24/44  lung]
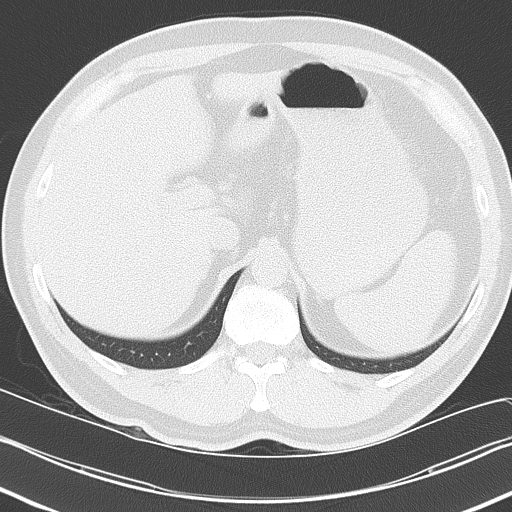
[im 29/44  soft-tissue]
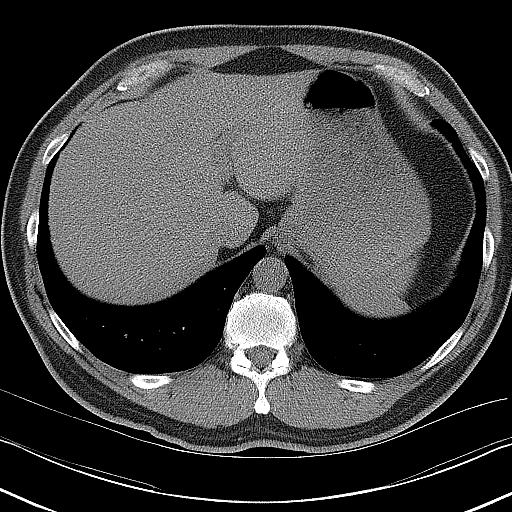
[im 29/44  lung]
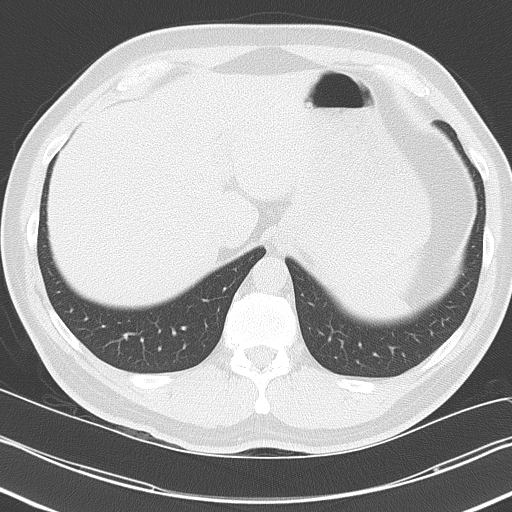
[im 34/44  soft-tissue]
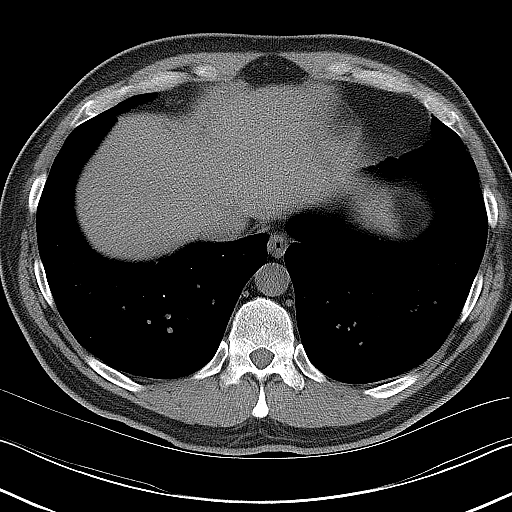
[im 34/44  lung]
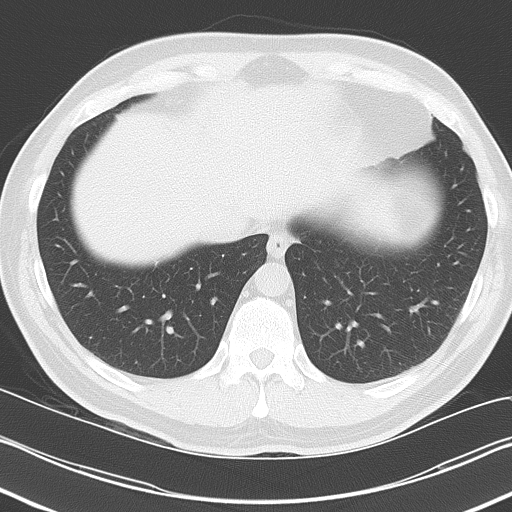
[im 39/44  soft-tissue]
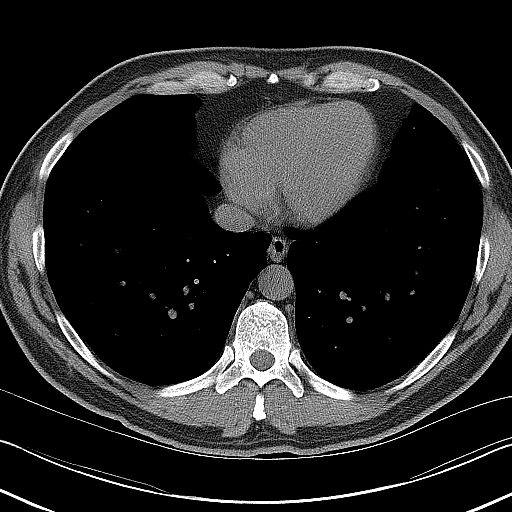
[im 39/44  lung]
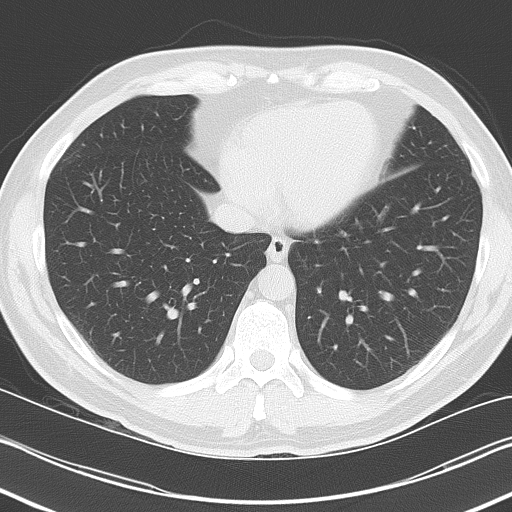

[12 of 46 positions shown; findings below may reference images not displayed]

FINDINGS: Lower chest:  No acute findings.

Hepatobiliary: No mass visualized on this un-enhanced exam. Prior
cholecystectomy noted. No evidence of biliary dilatation.

Pancreas: No mass or inflammatory process identified on this
un-enhanced exam.

Spleen: Within normal limits in size.

Adrenals/Urinary Tract: Mild right hydronephrosis and ureterectasis
is seen to the level the urinary bladder. A tiny 1 mm calculus is
seen at the right ureterovesical junction on image 79. No other
urinary calculi identified.

Stomach/Bowel: No evidence of obstruction, inflammatory process, or
abnormal fluid collections.

Vascular/Lymphatic: No pathologically enlarged lymph nodes. No
evidence of abdominal aortic aneurysm.

Reproductive: No mass or other significant abnormality.

Other: None.

Musculoskeletal:  No suspicious bone lesions identified.
IMPRESSION: Mild right hydroureteronephrosis, with tiny 1 mm calculus at the
right ureterovesical junction.

## 2015-12-28 ENCOUNTER — Ambulatory Visit (INDEPENDENT_AMBULATORY_CARE_PROVIDER_SITE_OTHER): Payer: Managed Care, Other (non HMO) | Admitting: Family Medicine

## 2015-12-28 ENCOUNTER — Encounter: Payer: Self-pay | Admitting: Family Medicine

## 2015-12-28 VITALS — BP 142/82 | Ht 70.0 in | Wt 186.0 lb

## 2015-12-28 DIAGNOSIS — IMO0001 Reserved for inherently not codable concepts without codable children: Secondary | ICD-10-CM

## 2015-12-28 DIAGNOSIS — H8110 Benign paroxysmal vertigo, unspecified ear: Secondary | ICD-10-CM | POA: Diagnosis not present

## 2015-12-28 DIAGNOSIS — H8120 Vestibular neuronitis, unspecified ear: Secondary | ICD-10-CM

## 2015-12-28 DIAGNOSIS — R030 Elevated blood-pressure reading, without diagnosis of hypertension: Secondary | ICD-10-CM

## 2015-12-28 MED ORDER — LORAZEPAM 0.5 MG PO TABS: 0.5000 mg | ORAL_TABLET | Freq: Three times a day (TID) | ORAL | Status: DC | PRN

## 2015-12-28 NOTE — Progress Notes (Signed)
   Subjective:    Patient ID: Kent Pearson, male    DOB: 06/13/1971, 45 y.o.   MRN: [id number]  HPI  Patient in today for Vestibular Neuritis. Patient states no other concerns this visit.  Not nearly as bad as it whas, though at times fairly significant.  The problem is the severe dizziness hits when the patient drives for any substantial distance. This can sometimes be just a few miles on the road. Currently he is working at home but he is worried that his employer may at one point insist on him working at the workplace. If that occurs he is concerned he will not be able to fulfill his job duties because of recurrent severe dizziness. When it hits the patient takes an Ativan, however this causes drowsy side effects.  Hit sweekly weather changes seem to make difernecne  Often flares up with sinuses  Pt had flare last wk with drinving in rain and windshield wipers  Visual cues can trigger  Computer screen used   elev. blood pressure, patient has had some history of this in the past.   Review of Systems No headache, no major weight loss or weight gain, no chest pain no back pain abdominal pain no change in bowel habits complete ROS otherwise negative     Objective:   Physical Exam Alert vitals stable blood pressure still elevated on repeat HEENT normal lungs clear. Heart regular in rhythm. No cerebellar dysfunction       Assessment & Plan:  Impression #1 history of vertigo-like symptoms has seen multiple specialists. Long-term chronic challenge. Causing substantial difficulty when driving for any substantial distance. Occurs sporadically but when it hits it is severe #2 elevated blood pressure discussed at length. Review of prior notes shows no major elevation. There is some family history. Plan regular exercise cut down salt intake. Chronic medicines written. Long discussion held. Patient thinks he is even on the edge of potential disability were he ever to be required to try to  his workplace. He asked that I write a letter discussing his limitations. 25 minutes spent most in discussion WSL

## 2016-01-02 DIAGNOSIS — IMO0001 Reserved for inherently not codable concepts without codable children: Secondary | ICD-10-CM | POA: Insufficient documentation

## 2016-01-02 DIAGNOSIS — R030 Elevated blood-pressure reading, without diagnosis of hypertension: Secondary | ICD-10-CM

## 2018-07-29 ENCOUNTER — Encounter: Payer: Self-pay | Admitting: Family Medicine

## 2018-07-29 ENCOUNTER — Ambulatory Visit (INDEPENDENT_AMBULATORY_CARE_PROVIDER_SITE_OTHER): Payer: Managed Care, Other (non HMO) | Admitting: Family Medicine

## 2018-07-29 ENCOUNTER — Other Ambulatory Visit: Payer: Self-pay

## 2018-07-29 VITALS — BP 136/88 | Ht 70.0 in | Wt 183.0 lb

## 2018-07-29 DIAGNOSIS — Z131 Encounter for screening for diabetes mellitus: Secondary | ICD-10-CM

## 2018-07-29 DIAGNOSIS — Z13220 Encounter for screening for lipoid disorders: Secondary | ICD-10-CM

## 2018-07-29 DIAGNOSIS — Z114 Encounter for screening for human immunodeficiency virus [HIV]: Secondary | ICD-10-CM

## 2018-07-29 DIAGNOSIS — H8120 Vestibular neuronitis, unspecified ear: Secondary | ICD-10-CM | POA: Diagnosis not present

## 2018-07-29 MED ORDER — ONDANSETRON HCL 4 MG PO TABS: 4.0000 mg | ORAL_TABLET | Freq: Four times a day (QID) | ORAL | 1 refills | Status: DC | PRN

## 2018-07-29 MED ORDER — LORAZEPAM 0.5 MG PO TABS: ORAL_TABLET | ORAL | 2 refills | Status: DC

## 2018-07-29 NOTE — Progress Notes (Addendum)
   Subjective:    Patient ID: Kent Pearson, male    DOB: 08/28/1970, 47 y.o.   MRN: [id number]  HPI  Patient is here today to follow up on his vestibular neuritis. He currently takes Ativan 0.5 mg prn. He states he mostly needs when he is traveling,and when he has a flare up.  Reports symptoms started in 2014 when driving to work got motion sickness, had to start working remotely, reports computer use caused motion sickness. Seen neurologist and ENT - neuro dx with vestibular neuritis. Did vestibular exercises which was helpful. States since then symptoms have gotten much better. Reports still has flares when driving long distances, bright light can also trigger, sinus problems can also trigger. Reports only takes ativan as needed, normally only needs one every few weeks, but depends on what is going on. States takes 0.25 mg of ativan usually, states this does not cause significant drowsiness.  Reports feels like symptoms have become more frequent the last few weeks, needed medication for several days. Medication usually helpful. States going outside for a walk is usually helpful as well.   ROS Negative for weakness, numbness, headache, syncope, or slurred speech Negative for chest pain or shortness of breath    Objective:   Physical Exam Vitals signs and nursing note reviewed.  Constitutional:      General: He is not in acute distress.    Appearance: He is well-developed.  HENT:     Head: Normocephalic and atraumatic.  Eyes:     General:        Right eye: No discharge.        Left eye: No discharge.     Extraocular Movements: Extraocular movements intact.     Pupils: Pupils are equal, round, and reactive to light.  Neck:     Musculoskeletal: Neck supple.  Cardiovascular:     Rate and Rhythm: Normal rate and regular rhythm.     Heart sounds: Normal heart sounds. No murmur.  Pulmonary:     Effort: Pulmonary effort is normal. No respiratory distress.     Breath sounds: Normal breath  sounds.  Skin:    General: Skin is warm and dry.  Neurological:     General: No focal deficit present.     Mental Status: He is alert and oriented to person, place, and time.     Coordination: Coordination is intact. Romberg sign negative. Coordination normal. Finger-Nose-Finger Test normal.     Gait: Gait is intact.           Assessment & Plan:  Vestibular neuronitis, unspecified laterality  Pt reports symptoms well-controlled with rare use of ativan, typically takes 0.25 mg dose, denies drowsiness with this dose. Discussed with patient that he should not drive if he is having drowsiness with medication. PMP database checked, refills given.   Recommend wellness exam with Dr. Brett CanalesSteve in the next few months, basic screening lab work ordered to have done prior to appt for them to review. No family hx of prostate cancer, so PSA level not ordered at this time.   BP slightly elevated, with hx of elevation on previous office visits. Recommended decrease sodium in diet, work on healthy eating and exercise. Will continue to monitor.  Dr. Lilyan PuntScott Luking was consulted on this case and is in agreement with the above treatment plan.

## 2018-10-28 ENCOUNTER — Encounter: Payer: Managed Care, Other (non HMO) | Admitting: Family Medicine

## 2019-02-05 ENCOUNTER — Other Ambulatory Visit: Payer: Managed Care, Other (non HMO)

## 2019-02-05 ENCOUNTER — Other Ambulatory Visit: Payer: Self-pay

## 2019-02-05 DIAGNOSIS — Z20822 Contact with and (suspected) exposure to covid-19: Secondary | ICD-10-CM

## 2019-02-11 LAB — NOVEL CORONAVIRUS, NAA: SARS-CoV-2, NAA: NOT DETECTED

## 2019-08-06 ENCOUNTER — Other Ambulatory Visit: Payer: Self-pay

## 2019-08-06 ENCOUNTER — Emergency Department (HOSPITAL_COMMUNITY)
Admission: EM | Admit: 2019-08-06 | Discharge: 2019-08-06 | Disposition: A | Discharge status: Home / Self Care | Payer: Managed Care, Other (non HMO) | Attending: Emergency Medicine | Admitting: Emergency Medicine

## 2019-08-06 ENCOUNTER — Ambulatory Visit
Admission: EM | Admit: 2019-08-06 | Discharge: 2019-08-06 | Disposition: A | Discharge status: Home / Self Care | Payer: Managed Care, Other (non HMO)

## 2019-08-06 ENCOUNTER — Emergency Department (HOSPITAL_COMMUNITY): Payer: Managed Care, Other (non HMO)

## 2019-08-06 DIAGNOSIS — R0789 Other chest pain: Secondary | ICD-10-CM | POA: Diagnosis not present

## 2019-08-06 DIAGNOSIS — Z79899 Other long term (current) drug therapy: Secondary | ICD-10-CM | POA: Diagnosis not present

## 2019-08-06 DIAGNOSIS — R079 Chest pain, unspecified: Secondary | ICD-10-CM | POA: Diagnosis present

## 2019-08-06 LAB — CBC
HCT: 43.4 % (ref 39.0–52.0)
Hemoglobin: 14.8 g/dL (ref 13.0–17.0)
MCH: 29.4 pg (ref 26.0–34.0)
MCHC: 34.1 g/dL (ref 30.0–36.0)
MCV: 86.3 fL (ref 80.0–100.0)
Platelets: 251 10*3/uL (ref 150–400)
RBC: 5.03 MIL/uL (ref 4.22–5.81)
RDW: 12.3 % (ref 11.5–15.5)
WBC: 5.2 10*3/uL (ref 4.0–10.5)
nRBC: 0 % (ref 0.0–0.2)

## 2019-08-06 LAB — BASIC METABOLIC PANEL
Anion gap: 7 (ref 5–15)
BUN: 13 mg/dL (ref 6–20)
CO2: 27 mmol/L (ref 22–32)
Calcium: 9.4 mg/dL (ref 8.9–10.3)
Chloride: 102 mmol/L (ref 98–111)
Creatinine, Ser: 0.84 mg/dL (ref 0.61–1.24)
GFR calc Af Amer: >60 mL/min (ref >60)
GFR calc non Af Amer: >60 mL/min (ref >60)
Glucose, Bld: 90 mg/dL (ref 70–99)
Potassium: 3.6 mmol/L (ref 3.5–5.1)
Sodium: 136 mmol/L (ref 135–145)

## 2019-08-06 LAB — TROPONIN I (HIGH SENSITIVITY)
Troponin I (High Sensitivity): <2 ng/L (ref <18)
Troponin I (High Sensitivity): <2 ng/L (ref <18)

## 2019-08-06 NOTE — Discharge Instructions (Signed)
As discussed, all of your labs, chest x-ray, and EKG were normal today. Follow-up with your PCP within the next week for further evaluation of your chest pain. Return to the ER for new or worsening symptoms.

## 2019-08-06 NOTE — ED Provider Notes (Signed)
Care assumed by Domenic Moras, PA-C at shift change. See his note for full HPI.  In short, patient presents to the ED due to left-sided and central intermittent chest pain x 1 day which he describes as pressure and a uncomfortable sensation. Patient notes pain started as he was vacuuming the house. He is chronically on Protonix for GERD, but stopped 2 days ago after he ran out. Patient is currently chest pain free. Patient denies associated nausea, shortness of breath, vomiting, diaphoresis, abdominal pain, back pain, and numbness/tingling. Patient denies recent illness. Patient denies recent travel, history of blood clots, hemoptysis, and recent immobilizations. Patient also notes mild improvement after lorazepam. He had a family member pass away today causing increased stress.  Lab work all reassuring.  Delta troponin negative.  CBC completely unremarkable with no leukocytosis.  BMP unremarkable with no electrolyte abnormalities and normal renal function.  Chest x-ray personally reviewed which is negative for signs of pneumonia and widening mediastinum.  EKG reviewed which demonstrates normal sinus rhythm with no signs of ischemia. Vitals reassuring with just mild elevation in BP at 138/98. Patient is not hypoxic or tachycardic.  Given patient does not have any significant cardiac risk factors with normal EKG and normal delta troponin low suspicion for cardiac etiology. Low suspicion for pulmonary etiology given normal CXR. PERC negative, doubt PE.  Suspect chest pain could be related to a stress response due to family member passing vs. GERD. Instructed patient to follow-up with PCP within the next week for further evaluation of chest pain. Strict ED precautions discussed with patient. Patient states understanding and agrees to plan. Patient discharged home in no acute distress and stable vitals    Karie Kirks 08/06/19 1753    Maudie Flakes, MD 08/06/19 2226

## 2019-08-06 NOTE — ED Triage Notes (Signed)
C/o chest pain onset today

## 2019-08-06 NOTE — ED Triage Notes (Signed)
Pt presents with c/o chest pain since yesterday with no improvement, BP 147/106 p87 98%. determined pt will need further evaluation in ED. Pt to travel by private vehicle

## 2019-08-06 NOTE — ED Provider Notes (Signed)
[facility] EMERGENCY DEPARTMENT Provider Note   CSN: [id number] Arrival date & time: 08/06/19  1351     History Chief Complaint  Patient presents with  . Chest Pain    Kent Pearson is a 48 y.o. male.  The history is provided by the patient. No language interpreter was used.  Chest Pain      48 year old male with history of GERD, presenting for evaluation of chest pain.  Patient reports intermittent chest discomfort since yesterday.  He described pain as a vague pressure uncomfortable sensation to his chest near his epigastric region and left chest that is mild but with some mild fatigue.  Symptoms have been intermittently.  Pain is minimal at this time.  No associated fever, chills, runny nose, sneezing, coughing, shortness of breath, diaphoresis, nausea, abdominal pain no back pain.  He does endorse some mild improvement after taking lorazepam.  He also report recent family member passed away today.  He also endorse his mom having heart attack last year "due to age" he denies any significant cardiac history.  He denies tobacco use.  He does admits to feeling a bit more stressed out.  Has history of GERD but states that this felt a bit different.  No recent strenuous activities aside from cleaning his house in preparation for the holiday.  Past Medical History:  Diagnosis Date  . Acid reflux   . Helicobacter pylori gastritis    18+ years ago per pt report    Patient Active Problem List   Diagnosis Date Noted  . Elevated blood pressure 01/02/2016  . 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 Headache(784.0) 06/03/2013  . GERD (gastroesophageal reflux disease) 05/26/2013  . Benign paroxysmal positional vertigo 04/24/2013  . Gastritis 04/01/2013  . Epigastric pain 07/19/2011    Past Surgical History:  Procedure Laterality Date  . CHOLECYSTECTOMY  1993  . collapsed lung  1988   high school  . EYE SURGERY  1983   left       Family History  Problem Relation Age of Onset  .  Colon cancer Neg Hx   . Ataxia Neg Hx   . Chorea Neg Hx   . Dementia Neg Hx   . Mental retardation Neg Hx   . Migraines Neg Hx   . Multiple sclerosis Neg Hx   . Neurofibromatosis Neg Hx   . Neuropathy Neg Hx   . Parkinsonism Neg Hx   . Seizures Neg Hx   . Stroke Neg Hx     Social History   Tobacco Use  . Smoking status: Never Smoker  . Smokeless tobacco: Never Used  Substance Use Topics  . Alcohol use: No  . Drug use: No    Home Medications Prior to Admission medications   Medication Sig Start Date End Date Taking? Authorizing Provider  loratadine (ALLERGY) 10 MG tablet Take 10 mg by mouth daily as needed for allergies or itching.    [provider]  LORazepam (ATIVAN) 0.5 MG tablet Take 1/2 to 1 tablet q 8 h prn for vestibular neuritis symptoms. Caution drowsiness 07/29/18   Cheyenne Adas, NP  omeprazole (PRILOSEC) 20 MG capsule Take 20 mg by mouth daily.    [provider]  ondansetron (ZOFRAN) 4 MG tablet Take 1 tablet (4 mg total) by mouth every 6 (six) hours as needed for nausea or vomiting. 07/29/18   Cheyenne Adas, NP    Allergies    Augmentin [amoxicillin-pot clavulanate]  Review of Systems   Review of Systems  Cardiovascular: Positive for chest pain.  All other systems reviewed and are negative.   Physical Exam Updated Vital Signs BP (!) 138/98   Pulse 87   Temp 98.8 F (37.1 C)   Resp 20   Wt 83.9 kg   SpO2 99%   BMI 26.54 kg/m   Physical Exam Vitals and nursing note reviewed.  Constitutional:      General: He is not in acute distress.    Appearance: He is well-developed.  HENT:     Head: Atraumatic.  Eyes:     Conjunctiva/sclera: Conjunctivae normal.  Cardiovascular:     Rate and Rhythm: Normal rate and regular rhythm.     Heart sounds: Normal heart sounds.  Pulmonary:     Effort: Pulmonary effort is normal.     Breath sounds: No decreased breath sounds, wheezing, rhonchi or rales.  Chest:     Chest wall: No  tenderness.  Abdominal:     Palpations: Abdomen is soft.     Tenderness: There is no abdominal tenderness.  Musculoskeletal:     Cervical back: Neck supple.     Right lower leg: No edema.     Left lower leg: No edema.  Skin:    Findings: No rash.  Neurological:     Mental Status: He is alert and oriented to person, place, and time.  Psychiatric:        Mood and Affect: Mood normal.     ED Results / Procedures / Treatments   Labs (all labs ordered are listed, but only abnormal results are displayed) Labs Reviewed  BASIC METABOLIC PANEL  CBC  TROPONIN I (HIGH SENSITIVITY)  TROPONIN I (HIGH SENSITIVITY)    EKG EKG Interpretation  Date/Time:  Thursday August 06 2019 14:22:13 EST Ventricular Rate:  84 PR Interval:  166 QRS Duration: 82 QT Interval:  354 QTC Calculation: 418 R Axis:   58 Text Interpretation: Normal sinus rhythm Normal ECG No old tracing to compare Confirmed by Eber HongMiller, Brian ([id number]) on 08/06/2019 2:34:58 PM   Radiology DG Chest 2 View  Result Date: 08/06/2019 CLINICAL DATA:  Chest pain. EXAM: CHEST - 2 VIEW COMPARISON:  None. FINDINGS: The heart size and mediastinal contours are within normal limits. Both lungs are clear. The visualized skeletal structures are unremarkable. IMPRESSION: Normal exam. Electronically Signed   By: Francene BoyersJames  Maxwell M.D.   On: 08/06/2019 14:38    Procedures Procedures (including critical care time)  Medications Ordered in ED Medications - No data to display  ED Course  I have reviewed the triage vital signs and the nursing notes.  Pertinent labs & imaging results that were available during my care of the patient were reviewed by me and considered in my medical decision making (see chart for details).    MDM Rules/Calculators/A&P                      BP (!) 138/98   Pulse 76   Temp 98.8 F (37.1 C)   Resp 20   Wt 83.9 kg   SpO2 99%   BMI 26.54 kg/m   Final Clinical Impression(s) / ED Diagnoses Final  diagnoses:  Atypical chest pain    Rx / DC Orders ED Discharge Orders    None     3:37 PM Patient here with atypical chest pain, suspect stress as a potential contributor.  He does not have any significant cardiac risk factors.  Pain is minimal at this time.  Work-up initiated.  Anticipate discharge home after negative delta troponin.  Pt sign out to oncoming provider to f/u on trop pending discharge. HEART score of 1, low risk of MACE. PERC negative, doubt PE. CXR normal, doubt PNA.    Fayrene Helper, PA-C 08/06/19 1603    Sabas Sous, MD 08/06/19 2227

## 2019-09-16 ENCOUNTER — Encounter: Payer: Self-pay | Admitting: Family Medicine

## 2020-01-10 IMAGING — DX DG CHEST 2V
2 series · 2 of 2 positions shown · non-contrast
Comparison: None.

CLINICAL DATA: Chest pain.

EXAM:
CHEST - 2 VIEW

[chest pa]
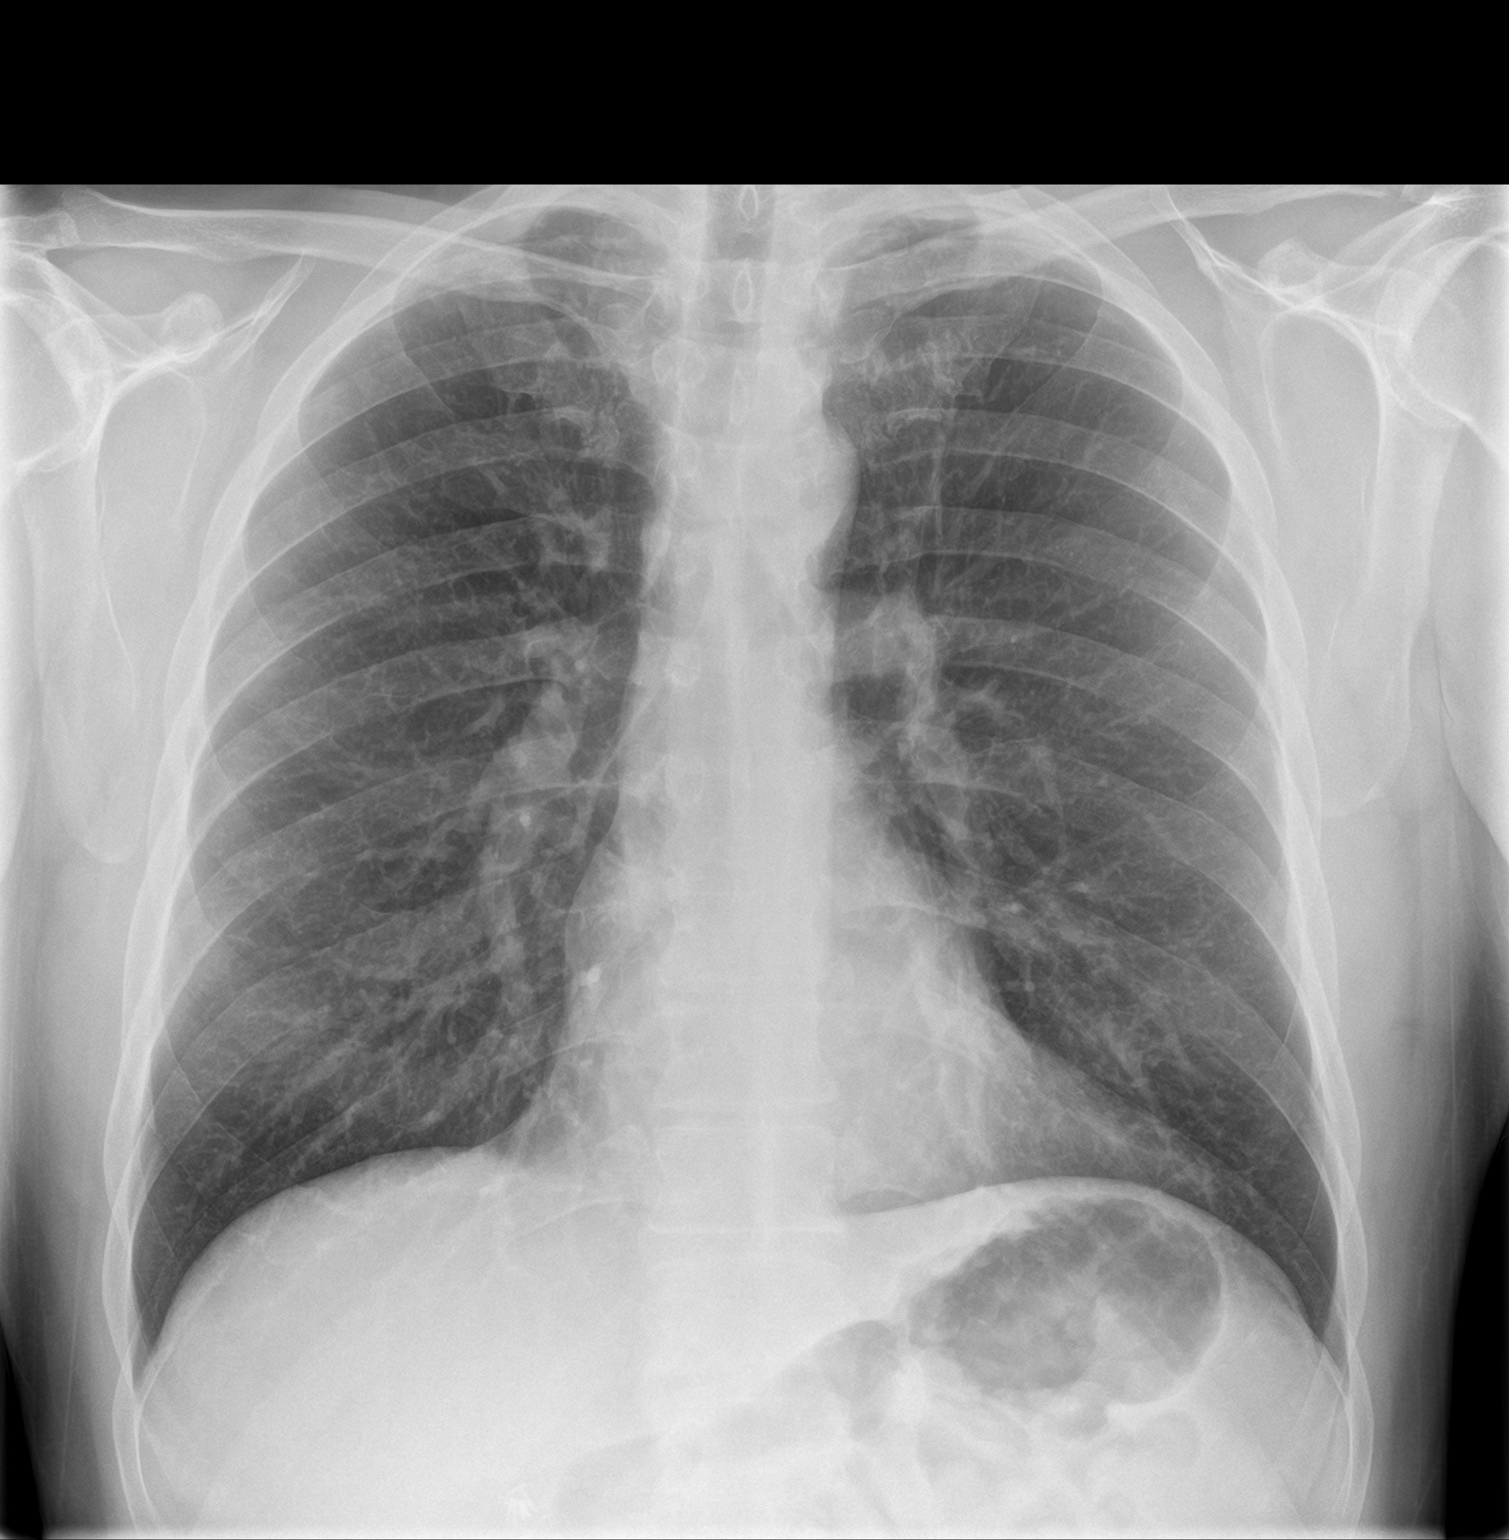

[chest lat]
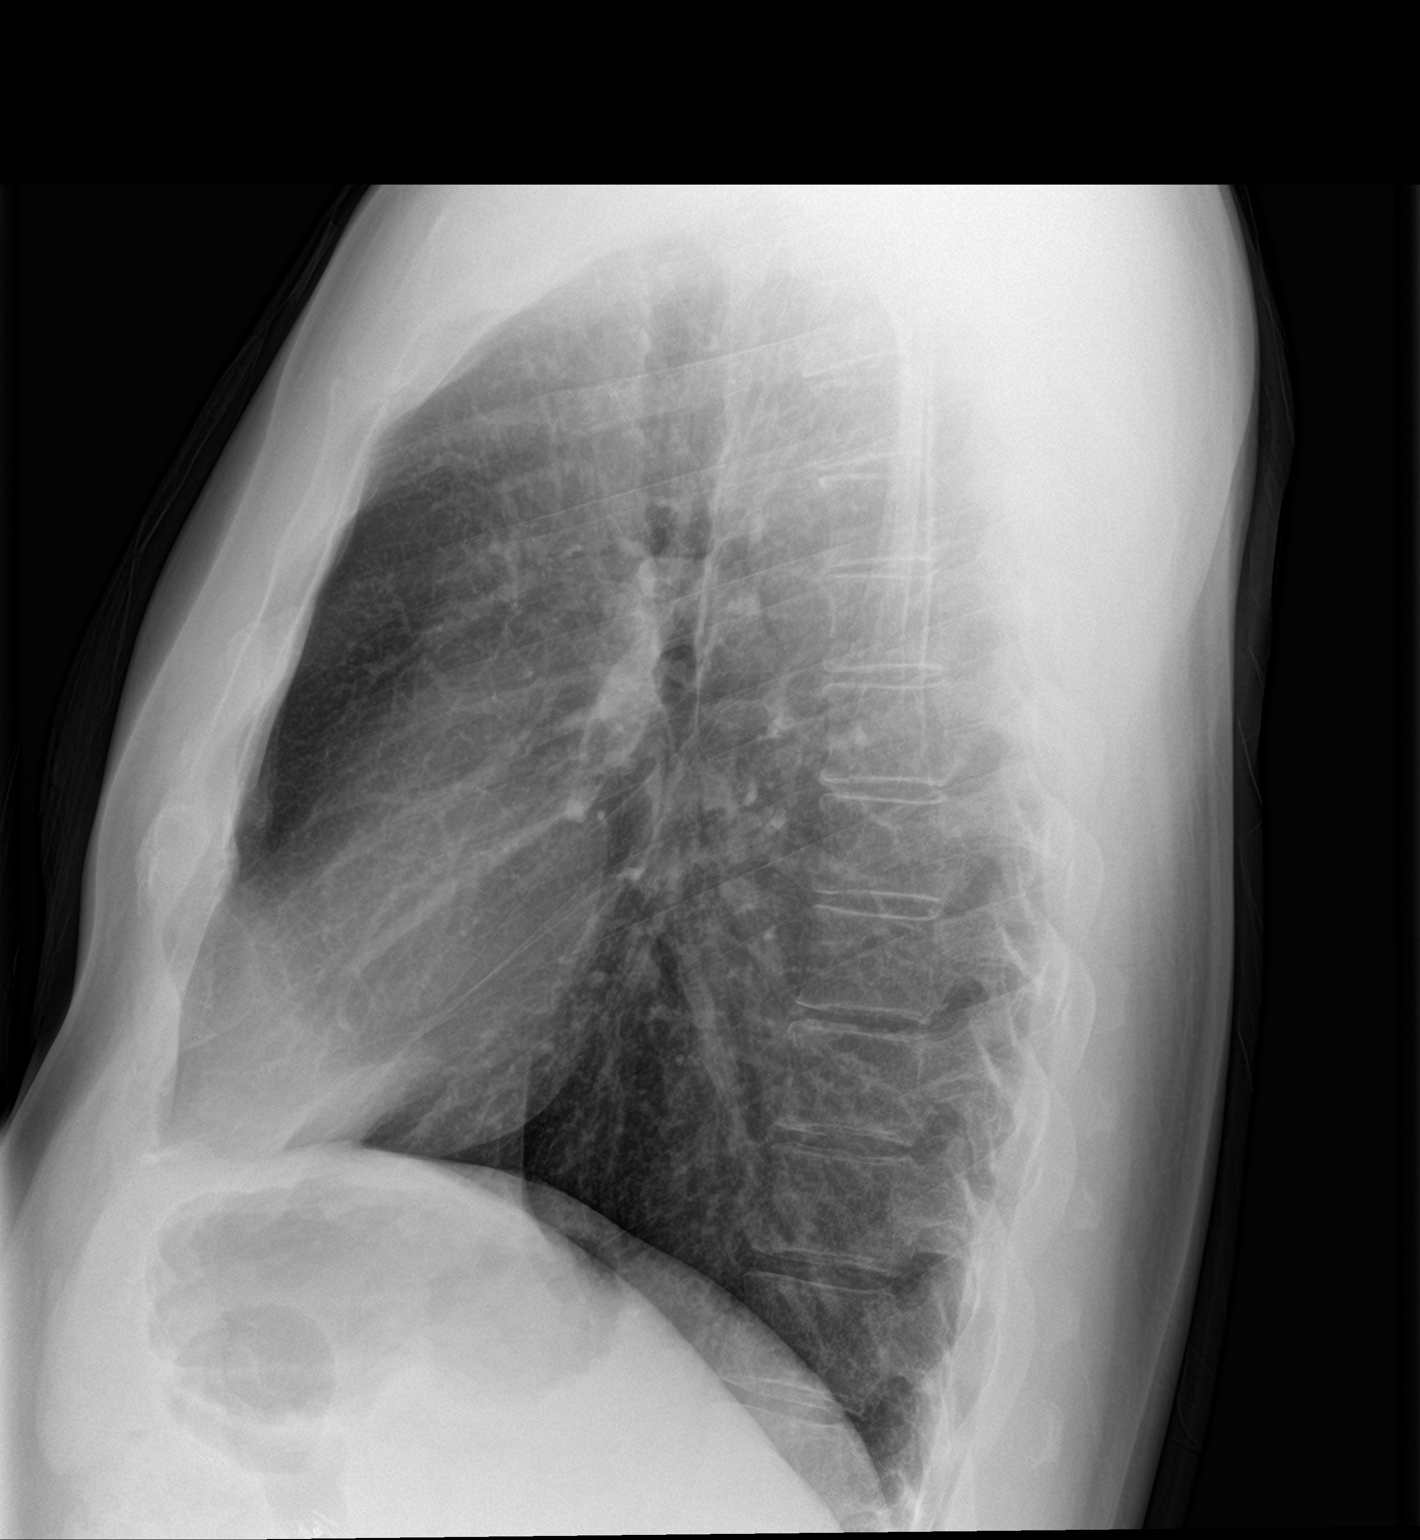

[2 of 2 positions shown; findings below may reference images not displayed]

FINDINGS: The heart size and mediastinal contours are within normal limits.
Both lungs are clear. The visualized skeletal structures are
unremarkable.
IMPRESSION: Normal exam.

## 2020-08-18 ENCOUNTER — Other Ambulatory Visit: Payer: Self-pay

## 2020-08-18 ENCOUNTER — Telehealth (INDEPENDENT_AMBULATORY_CARE_PROVIDER_SITE_OTHER): Payer: Managed Care, Other (non HMO) | Admitting: Family Medicine

## 2020-08-18 DIAGNOSIS — J0100 Acute maxillary sinusitis, unspecified: Secondary | ICD-10-CM | POA: Diagnosis not present

## 2020-08-18 DIAGNOSIS — Z8616 Personal history of COVID-19: Secondary | ICD-10-CM | POA: Diagnosis not present

## 2020-08-18 MED ORDER — AZITHROMYCIN 250 MG PO TABS: ORAL_TABLET | ORAL | 0 refills | Status: DC

## 2020-08-18 NOTE — Progress Notes (Signed)
Patient ID: Kent Pearson, male    DOB: [birth year], 50 y.o.   MRN: [id number]  Virtual Visit via Telephone Note  I connected with Lysle Rubens on 08/18/20 at  9:00 AM EST by telephone and verified that I am speaking with the correct person using two identifiers.  Location: Patient: home Provider: office   I discussed the limitations, risks, security and privacy concerns of performing an evaluation and management service by telephone and the availability of in person appointments. I also discussed with the patient that there may be a patient responsible charge related to this service. The patient expressed understanding and agreed to proceed.    Chief Complaint  Patient presents with  . Sinus Problem    Cough, headache since Friday No Covid testing- patient states he had Covid in March and it doesn't feel the same- whole family had a cold and he is the only one that has lingered   Subjective:    HPI Having coughing and headache started 6 days ago.  Was at a basketball tournament.  Son and wife with it, they have improved.  Had covid in 3/21. Had achiness, stuffiness, coughing. Then down in throat and losing voice. Not had covid testing. Sinus drainage green and brown last couple days. Had elevated temp 99.45F, in beginning. Taking tylenol and ibuprofen. Having facial pressure and headache.  Working remote.  Medical History Windle has a past medical history of Acid reflux and Helicobacter pylori gastritis.   Outpatient Encounter Medications as of 08/18/2020  Medication Sig  . azithromycin (ZITHROMAX Z-PAK) 250 MG tablet Take 2 tab p.o. day 1, then take 1 tab daily for 4 more days.  . Ascorbic Acid (VITAMIN C WITH ROSE HIPS) 500 MG tablet Take 500 mg by mouth daily.  . cholecalciferol (VITAMIN D3) 25 MCG (1000 UT) tablet Take 1,000 Units by mouth daily.  Marland Kitchen loratadine (ALLERGY) 10 MG tablet Take 10 mg by mouth every evening.   Marland Kitchen LORazepam (ATIVAN) 0.5 MG tablet Take 1/2 to  1 tablet q 8 h prn for vestibular neuritis symptoms. Caution drowsiness (Patient taking differently: Take 0.25-0.5 mg by mouth every 8 (eight) hours as needed. Take 1/2 to 1 tablet q 8 h prn for vestibular neuritis symptoms. Caution drowsiness)  . Multiple Vitamin (MULTIVITAMIN WITH MINERALS) TABS tablet Take 1 tablet by mouth daily.  Marland Kitchen omeprazole (PRILOSEC) 20 MG capsule Take 20 mg by mouth daily.   No facility-administered encounter medications on file as of 08/18/2020.     Review of Systems  Constitutional: Negative for chills and fever.  HENT: Positive for congestion, sinus pressure, sinus pain and voice change. Negative for ear discharge, ear pain, postnasal drip, rhinorrhea and sore throat.   Respiratory: Positive for cough. Negative for shortness of breath and wheezing.   Cardiovascular: Negative for chest pain and leg swelling.  Gastrointestinal: Negative for abdominal pain, diarrhea, nausea and vomiting.  Genitourinary: Negative for dysuria and frequency.  Skin: Negative for rash.  Neurological: Negative for dizziness, weakness and headaches.     Vitals There were no vitals taken for this visit.  Objective:   Physical Exam No PE due to phone visit.  Assessment and Plan   1. Acute non-recurrent maxillary sinusitis   Recommending covid testing and pt stating had covid in spring, and doesn't feel this is the same type of illness.  Still recommending testing.  Reviewed viral vs. Bacterial sinusitis and gave azithromycin. Advising mucinex/robitussin, increase fluids, tylenol/ibuprofen prn, flonase. Pt to call  or rto in 2-3 days if not improving.  F/u prn.   Follow Up Instructions:    I discussed the assessment and treatment plan with the patient. The patient was provided an opportunity to ask questions and all were answered. The patient agreed with the plan and demonstrated an understanding of the instructions.   The patient was advised to call back or seek an in-person  evaluation if the symptoms worsen or if the condition fails to improve as anticipated.  I provided 15 minutes of non-face-to-face time during this encounter.

## 2021-11-29 ENCOUNTER — Ambulatory Visit
Admission: EM | Admit: 2021-11-29 | Discharge: 2021-11-29 | Disposition: A | Discharge status: Home / Self Care | Payer: Managed Care, Other (non HMO) | Attending: Family Medicine | Admitting: Family Medicine

## 2021-11-29 ENCOUNTER — Ambulatory Visit: Payer: Self-pay

## 2021-11-29 DIAGNOSIS — H8120 Vestibular neuronitis, unspecified ear: Secondary | ICD-10-CM

## 2021-11-29 DIAGNOSIS — J0100 Acute maxillary sinusitis, unspecified: Secondary | ICD-10-CM | POA: Diagnosis not present

## 2021-11-29 MED ORDER — AZITHROMYCIN 250 MG PO TABS: 250.0000 mg | ORAL_TABLET | Freq: Every day | ORAL | 0 refills | Status: DC

## 2021-11-29 MED ORDER — LORAZEPAM 0.5 MG PO TABS: ORAL_TABLET | ORAL | 0 refills | Status: AC

## 2021-11-29 NOTE — Discharge Instructions (Signed)
Be aware, you have been prescribed a medication that may cause drowsiness. Do not combine with alcohol or other illicit drugs. Please do not drive, operate heavy machinery, or take part in activities that require making important decisions while on this medication as your judgement may be clouded.  

## 2021-11-29 NOTE — ED Triage Notes (Signed)
Pt reports sinus pressure and drainage x 2 -3 days. OTC meds gives no relief.  ? ?Pt requested lorazepam 0.5 mg, refill.  ?

## 2021-11-29 NOTE — ED Provider Notes (Signed)
?[facility] ? ? ?[id number] ?11/29/21 Arrival Time: 1400 ? ?ASSESSMENT & PLAN: ? ?1. Acute non-recurrent maxillary sinusitis   ?2. Vestibular neuronitis, unspecified laterality   ? ?Meds ordered this encounter  ?Medications  ? LORazepam (ATIVAN) 0.5 MG tablet  ?  Sig: Take 1/2 to 1 tablet q 8 h prn for vestibular neuritis symptoms.  ?  Dispense:  20 tablet  ?  Refill:  0  ? azithromycin (ZITHROMAX) 250 MG tablet  ?  Sig: Take 1 tablet (250 mg total) by mouth daily. Take first 2 tablets together, then 1 every day until finished.  ?  Dispense:  6 tablet  ?  Refill:  0  ? ?OTC symptom care as needed. ?Ensure adequate fluid intake and rest. ? ?[place] Controlled Substances Registry consulted for this patient. I feel the risk/benefit ratio today is favorable for proceeding with this prescription for a controlled substance. Medication sedation precautions given. ? ? Follow-up Information   ? ? Ladona Ridgel, Malena M, DO.   ?Specialty: Family Medicine ?Why: If worsening or failing to improve as anticipated. ?Contact information: ?[address] ?[place] [address] ?[phone] ? ? ?  ?  ? ?  ?  ? ?  ? ? ?Reviewed expectations re: course of current medical issues. Questions answered. ?Outlined signs and symptoms indicating need for more acute intervention. ?Patient verbalized understanding. ?After Visit Summary given. ? ? ?SUBJECTIVE: ?History from: patient. ? ?Kent Pearson is a 51 y.o. male who presents with complaint of nasal congestion, post-nasal drainage, and sinus pain. Onset gradual,  > 1 wk ago . Respiratory symptoms: minimal cough. Fever: absent. Overall normal PO intake without n/v. Does feel pain in upper teeth. ?History of frequent sinus infections: no. No specific aggravating or alleviating factors reported. Has also been dx with vestibular neuritis; exacerbation with current illness. Out of Ativan which does help symptoms; requests refill. No visual changes. ? ?Social History  ? ?Tobacco Use  ?Smoking  Status Never  ?Smokeless Tobacco Never  ? ? ?OBJECTIVE: ? ?Vitals:  ? 11/29/21 1406  ?BP: (!) 157/92  ?Pulse: 95  ?Resp: 18  ?Temp: 98.7 ?F (37.1 ?C)  ?TempSrc: Oral  ?SpO2: 94%  ?  ? ?General appearance: alert; no distress ?HEENT: nasal congestion; clear runny nose; throat irritation secondary to post-nasal drainage; bilateral maxillary tenderness to palpation; turbinates boggy ?Neck: supple without LAD; trachea midline ?Lungs: unlabored respirations, symmetrical air entry; cough: absent; no respiratory distress ?Skin: warm and dry ?Neuro: CN 2-12 grossly intact; normal gait ?Psychological: alert and cooperative; normal mood and affect ? ?Allergies  ?Allergen Reactions  ? Augmentin [Amoxicillin-Pot Clavulanate] Nausea And Vomiting  ?  Nausea and vomiting  ? ? ?Past Medical History:  ?Diagnosis Date  ? Acid reflux   ? Helicobacter pylori gastritis   ? 18+ years ago per pt report  ? ?Family History  ?Problem Relation Age of Onset  ? Colon cancer Neg Hx   ? Ataxia Neg Hx   ? Chorea Neg Hx   ? Dementia Neg Hx   ? Mental retardation Neg Hx   ? Migraines Neg Hx   ? Multiple sclerosis Neg Hx   ? Neurofibromatosis Neg Hx   ? Neuropathy Neg Hx   ? Parkinsonism Neg Hx   ? Seizures Neg Hx   ? Stroke Neg Hx   ? ?Social History  ? ?Socioeconomic History  ? Marital status: Married  ?  Spouse name: Not on file  ? Number of children: Not on file  ?  Years of education: Not on file  ? Highest education level: Not on file  ?Occupational History  ? Not on file  ?Tobacco Use  ? Smoking status: Never  ? Smokeless tobacco: Never  ?Substance and Sexual Activity  ? Alcohol use: No  ? Drug use: No  ? Sexual activity: Not on file  ?Other Topics Concern  ? Not on file  ?Social History Narrative  ? Not on file  ? ?Social Determinants of Health  ? ?Financial Resource Strain: Not on file  ?Food Insecurity: Not on file  ?Transportation Needs: Not on file  ?Physical Activity: Not on file  ?Stress: Not on file  ?Social Connections: Not on file   ?Intimate Partner Violence: Not on file  ? ? ? ? ? ? ? ? ? ?  ?Mardella Layman, MD ?11/29/21 1423 ? ?

## 2022-04-25 ENCOUNTER — Encounter (INDEPENDENT_AMBULATORY_CARE_PROVIDER_SITE_OTHER): Payer: Self-pay | Admitting: *Deleted

## 2022-07-16 ENCOUNTER — Ambulatory Visit (INDEPENDENT_AMBULATORY_CARE_PROVIDER_SITE_OTHER): Payer: Managed Care, Other (non HMO) | Admitting: Gastroenterology

## 2022-09-24 ENCOUNTER — Encounter (INDEPENDENT_AMBULATORY_CARE_PROVIDER_SITE_OTHER): Payer: Self-pay | Admitting: Gastroenterology

## 2022-09-24 ENCOUNTER — Ambulatory Visit (INDEPENDENT_AMBULATORY_CARE_PROVIDER_SITE_OTHER): Payer: Managed Care, Other (non HMO) | Admitting: Gastroenterology

## 2022-09-24 VITALS — BP 167/99 | HR 96 | Temp 97.8°F | Ht 70.0 in | Wt 197.8 lb

## 2022-09-24 DIAGNOSIS — R195 Other fecal abnormalities: Secondary | ICD-10-CM | POA: Diagnosis not present

## 2022-09-24 DIAGNOSIS — R109 Unspecified abdominal pain: Secondary | ICD-10-CM

## 2022-09-24 DIAGNOSIS — K2970 Gastritis, unspecified, without bleeding: Secondary | ICD-10-CM | POA: Diagnosis not present

## 2022-09-24 MED ORDER — HYOSCYAMINE SULFATE 0.125 MG SL SUBL: 0.1250 mg | SUBLINGUAL_TABLET | SUBLINGUAL | 0 refills | Status: DC | PRN

## 2022-09-24 NOTE — Patient Instructions (Signed)
Schedule colonoscopy Can take Pepcid as needed for episodes of burning in upper abdomen Can take Levsin as needed if having abdominal pain when fasting or when skipping meals. The patient was found to have elevated blood pressure when vital signs were checked in the office. The blood pressure was rechecked by the nursing staff and it was found be persistently elevated >140/90 mmHg. I personally advised to the patient to follow up closely with PCP for hypertension control.

## 2022-09-24 NOTE — Progress Notes (Signed)
Kent Pearson, M.D. Gastroenterology & Hepatology [facility] [address] [place], [place] [address] Primary Care Physician: Curlene Labrum, MD [place] [id number]  Referring MD: PCP  Chief Complaint: Positive Cologuard and abdominal pain.  History of Present Illness: Kent Pearson is a 52 y.o. male with past medical history of GERD and H. pylori, who presents for evaluation of positive Cologuard and abdominal pain.  Patient was referred to our office as he had a positive Cologuard on 04/11/2022.  Patient reports that he had a history of upper abdominal burning pain chronically,for which he took omeprazole chronically daily for 10 years. He took himself off the medication around 2 years ago. He states he has these episodes of pain 3-4 times a month, for which he takes omeprazole or an antacid (Tums). Rarely has burning sensation in the upper part of his chest.  Patient reports that he was diagnosed with H. Pylori multiple years ago. Never had confirmation of eradication.  He states that he has had a "longstanding history of sensitive stomach". He states that if he misses a meal he will have some abdominal pain and bloating, followed by a loose stool. If he does not miss meals then he will not have any other symptoms.  The patient denies having any nausea, vomiting, fever, chills, hematochezia, melena, hematemesis, abdominal distention, diarrhea, jaundice, pruritus or weight loss.  Most recent blood workup from 03/20/2022 showed WBC 6.1, hemoglobin 15.5, platelets 233, CMP with sodium 139, potassium 4.7, creatinine 0.93, normal liver enzymes with ALT 30, AST 18, total bilirubin 0.6, alkaline phosphatase 79 and albumin 4.5.  TSH 0.8.  Last EGD:20 years ago, was found to have gastritis and h pylori per patient, no report available Last Colonoscopy:never  FHx: neg for any gastrointestinal/liver disease, grandfather lung  cancer Social: neg smoking, alcohol or illicit drug use Surgical: cholecystectomy  Past Medical History: Past Medical History:  Diagnosis Date   Acid reflux    Helicobacter pylori gastritis    18+ years ago per pt report    Past Surgical History: Past Surgical History:  Procedure Laterality Date   CHOLECYSTECTOMY  1993   collapsed lung  1988   high school   EYE SURGERY  1983   left    Family History: Family History  Problem Relation Age of Onset   Colon cancer Neg Hx    Ataxia Neg Hx    Chorea Neg Hx    Dementia Neg Hx    Mental retardation Neg Hx    Migraines Neg Hx    Multiple sclerosis Neg Hx    Neurofibromatosis Neg Hx    Neuropathy Neg Hx    Parkinsonism Neg Hx    Seizures Neg Hx    Stroke Neg Hx     Social History: Social History   Tobacco Use  Smoking Status Never  Smokeless Tobacco Never   Social History   Substance and Sexual Activity  Alcohol Use No   Social History   Substance and Sexual Activity  Drug Use No    Allergies: Allergies  Allergen Reactions   Augmentin [Amoxicillin-Pot Clavulanate] Nausea And Vomiting    Nausea and vomiting    Medications: Current Outpatient Medications  Medication Sig Dispense Refill   acetaminophen (TYLENOL) 325 MG tablet Take 650 mg by mouth every 6 (six) hours as needed.     ibuprofen (ADVIL) 200 MG tablet Take 200 mg by mouth every 6 (six) hours as needed.  loratadine (ALLERGY) 10 MG tablet Take 10 mg by mouth every evening.     LORazepam (ATIVAN) 0.5 MG tablet Take 1/2 to 1 tablet q 8 h prn for vestibular neuritis symptoms. 20 tablet 0   omeprazole (PRILOSEC) 20 MG capsule Take 20 mg by mouth daily.     Ascorbic Acid (VITAMIN C WITH ROSE HIPS) 500 MG tablet Take 500 mg by mouth daily. (Patient not taking: Reported on 09/24/2022)     cholecalciferol (VITAMIN D3) 25 MCG (1000 UT) tablet Take 1,000 Units by mouth daily. (Patient not taking: Reported on 09/24/2022)     Multiple Vitamin (MULTIVITAMIN  WITH MINERALS) TABS tablet Take 1 tablet by mouth daily. (Patient not taking: Reported on 09/24/2022)     No current facility-administered medications for this visit.    Review of Systems: GENERAL: negative for malaise, night sweats HEENT: No changes in hearing or vision, no nose bleeds or other nasal problems. NECK: Negative for lumps, goiter, pain and significant neck swelling RESPIRATORY: Negative for cough, wheezing CARDIOVASCULAR: Negative for chest pain, leg swelling, palpitations, orthopnea GI: SEE HPI MUSCULOSKELETAL: Negative for joint pain or swelling, back pain, and muscle pain. SKIN: Negative for lesions, rash PSYCH: Negative for sleep disturbance, mood disorder and recent psychosocial stressors. HEMATOLOGY Negative for prolonged bleeding, bruising easily, and swollen nodes. ENDOCRINE: Negative for cold or heat intolerance, polyuria, polydipsia and goiter. NEURO: negative for tremor, gait imbalance, syncope and seizures. The remainder of the review of systems is noncontributory.   Physical Exam: BP (!) 167/99 (BP Location: Left Arm, Patient Position: Sitting, Cuff Size: Large)   Pulse 96   Temp 97.8 F (36.6 C) (Temporal)   Ht 5' 10"$  (1.778 m)   Wt 197 lb 12.8 oz (89.7 kg)   BMI 28.38 kg/m  GENERAL: The patient is AO x3, in no acute distress. HEENT: Head is normocephalic and atraumatic. EOMI are intact. Mouth is well hydrated and without lesions. NECK: Supple. No masses LUNGS: Clear to auscultation. No presence of rhonchi/wheezing/rales. Adequate chest expansion HEART: RRR, normal s1 and s2. ABDOMEN: Soft, nontender, no guarding, no peritoneal signs, and nondistended. BS +. No masses. EXTREMITIES: Without any cyanosis, clubbing, rash, lesions or edema. NEUROLOGIC: AOx3, no focal motor deficit. SKIN: no jaundice, no rashes   Imaging/Labs: as above  I personally reviewed and interpreted the available labs, imaging and endoscopic files.  Impression and  Plan: Kent Pearson is a 52 y.o. male with past medical history of GERD and H. pylori, who presents for evaluation of positive Cologuard and abdominal pain.  Patient does not have any high risk factors for colorectal cancer malignancy.  She has been asymptomatic. Discussed cologuard test results in detail, specifically what it means when the test is positive or negative.  Discussed that there is a possibility that even when the test is positive there may not be a polyp found on colonoscopy. More than 50% of the office visit was dedicated to discussing the procedure, including the day of and risks involved. Patient understands what the procedure involves including the benefits and any risks. Patient understands alternatives to the proposed procedure. Risks including (but not limited to) bleeding, tearing of the lining (perforation), rupture of adjacent organs, problems with heart and lung function, infection, and medication reactions. A small percentage of complications may require surgery, hospitalization, repeat endoscopic procedure, and/or transfusion. A small percentage of polyps and other tumors may not be seen.  Patient has presented intermittent episodes of abdominal pain and rare reflux related symptoms.  He can take Pepcid as needed when he has these episodes instead of taking Tums.  If symptoms were to worsen or become more frequent despite taking Pepcid, will need to start him on a PPI daily and evaluated esophagogastroduodenospy, may need to consider in the future confirmation of H. pylori eradication.  The patient was found to have elevated blood pressure when vital signs were checked in the office. The blood pressure was rechecked by the nursing staff and it was found be persistently elevated >140/90 mmHg. I personally advised to the patient to follow up closely with PCP for hypertension control.  - Schedule colonoscopy - Can take Pepcid as needed for episodes of burning in upper abdomen -  Can take Levsin as needed if having abdominal pain when fasting or when skipping meals.  All questions were answered.      Kent Peppers, MD Gastroenterology and Hepatology [facility]

## 2022-09-25 ENCOUNTER — Telehealth: Payer: Self-pay | Admitting: *Deleted

## 2022-09-25 NOTE — Telephone Encounter (Signed)
[facility]  Colonoscopy w/Dr.Castaneda, Room 1 (+) cologuard

## 2022-10-25 ENCOUNTER — Encounter: Payer: Self-pay | Admitting: *Deleted

## 2022-10-25 NOTE — Telephone Encounter (Signed)
[facility] Mailed letter

## 2022-12-14 ENCOUNTER — Ambulatory Visit
Admission: RE | Admit: 2022-12-14 | Discharge: 2022-12-14 | Disposition: A | Discharge status: Home / Self Care | Payer: Managed Care, Other (non HMO) | Source: Ambulatory Visit | Attending: Physician Assistant | Admitting: Physician Assistant

## 2022-12-14 VITALS — BP 157/99 | HR 88 | Temp 98.7°F | Resp 19

## 2022-12-14 DIAGNOSIS — J0100 Acute maxillary sinusitis, unspecified: Secondary | ICD-10-CM | POA: Diagnosis not present

## 2022-12-14 DIAGNOSIS — H109 Unspecified conjunctivitis: Secondary | ICD-10-CM

## 2022-12-14 MED ORDER — ERYTHROMYCIN 5 MG/GM OP OINT: TOPICAL_OINTMENT | OPHTHALMIC | 0 refills | Status: AC

## 2022-12-14 MED ORDER — DOXYCYCLINE HYCLATE 100 MG PO CAPS: 100.0000 mg | ORAL_CAPSULE | Freq: Two times a day (BID) | ORAL | 0 refills | Status: AC

## 2022-12-14 NOTE — ED Triage Notes (Signed)
Pt reports cough x 6 days; nasal congestion, sore throat, chest congestion and fever x 5 days; watery eyes x 1 day. Denies sore throat at this moment.

## 2022-12-14 NOTE — Discharge Instructions (Signed)
We are going to cover for a bacterial sinus infection.  Please start doxycycline 100 mg twice daily for 7 days.  Stay out of the sun while on this medication.  Use over-the-counter medication including Mucinex, Flonase, Tylenol.  I also recommend nasal saline and sinus rinses for additional symptom relief.  Use erythromycin ointment twice daily for 7 days.  Make sure to wash your hands prior to handling medication and make sure that you do not touch tip of medication bottle to the eye as this can cause contamination of the medicine.  Keep the eye clean with warm compress and use artificial tears for additional symptom relief.  Follow-up with your primary care if symptoms or not significantly improved over the weekend.  If at any point you have worsening symptoms including high fever, shortness of breath, worsening cough, weakness, vision change you need to be seen immediately.

## 2022-12-14 NOTE — ED Provider Notes (Signed)
[facility]    CSN: [id number] Arrival date & time: 12/14/22  1354      History   Chief Complaint Chief Complaint  Patient presents with   Sore Throat    Cough, sore throat, chest congestion and fever for several days - Entered by patient   Appointment    1400    HPI Kent Pearson is a 52 y.o. male.   Patient presents today with a week plus long history of URI symptoms.  Reports initially had a cough that then developed sore throat and nasal congestion.  The symptoms have worsened and he has been experiencing fever as well as worsening congestion over the past several days prompting evaluation.  He has also developed drainage from his eyes to the point that he had difficulty opening his eyes when he woke up this morning.  He does wear glasses but does not wear contacts.  He has tried over-the-counter allergy medication without improvement of symptoms including loratadine.  He has also taken ibuprofen.  Denies any recent antibiotics or steroids.  He believes he has had COVID in the past but is unsure when his last episode was.  He has not had COVID-19 vaccinations.  He denies any history of asthma, COPD, smoking.  Denies history of diabetes or cardiovascular disease.  Reports that he is here today because his symptoms are worsening and he is concerned that they have not responded to over-the-counter medicines.    Past Medical History:  Diagnosis Date   Acid reflux    Helicobacter pylori gastritis    18+ years ago per pt report    Patient Active Problem List   Diagnosis Date Noted   Positive colorectal cancer screening using Cologuard test 09/24/2022   Abdominal pain 09/24/2022   History of COVID-19 08/18/2020   Elevated blood pressure 01/02/2016   Vestibular neuritis 06/23/2013   Headache(784.0) 06/03/2013   GERD (gastroesophageal reflux disease) 05/26/2013   Benign paroxysmal positional vertigo 04/24/2013   Gastritis 04/01/2013   Epigastric pain 07/19/2011     Past Surgical History:  Procedure Laterality Date   CHOLECYSTECTOMY  1993   collapsed lung  1988   high school   EYE SURGERY  1983   left       Home Medications    Prior to Admission medications   Medication Sig Start Date End Date Taking? Authorizing Provider  doxycycline (VIBRAMYCIN) 100 MG capsule Take 1 capsule (100 mg total) by mouth 2 (two) times daily. 12/14/22  Yes Mukhtar Shams K, PA-C  erythromycin ophthalmic ointment Place a 1/2 inch ribbon of ointment into the lower eyelid of both eyes twice daily for 7 days 12/14/22  Yes Mayling Aber K, PA-C  cholecalciferol (VITAMIN D3) 25 MCG (1000 UT) tablet Take 1,000 Units by mouth daily. Patient not taking: Reported on 09/24/2022    [provider]  ibuprofen (ADVIL) 200 MG tablet Take 200 mg by mouth every 6 (six) hours as needed.    [provider]  loratadine (ALLERGY) 10 MG tablet Take 10 mg by mouth every evening.    [provider]  LORazepam (ATIVAN) 0.5 MG tablet Take 1/2 to 1 tablet q 8 h prn for vestibular neuritis symptoms. 11/29/21   Mardella Layman, MD  Multiple Vitamin (MULTIVITAMIN WITH MINERALS) TABS tablet Take 1 tablet by mouth daily.    [provider]  omeprazole (PRILOSEC) 20 MG capsule Take 20 mg by mouth daily.    [provider]    [facility]  History Family History  Problem Relation Age of Onset   Colon cancer Neg Hx    Ataxia Neg Hx    Chorea Neg Hx    Dementia Neg Hx    Mental retardation Neg Hx    Migraines Neg Hx    Multiple sclerosis Neg Hx    Neurofibromatosis Neg Hx    Neuropathy Neg Hx    Parkinsonism Neg Hx    Seizures Neg Hx    Stroke Neg Hx     Social History Social History   Tobacco Use   Smoking status: Never   Smokeless tobacco: Never  Vaping Use   Vaping Use: Never used  Substance Use Topics   Alcohol use: No   Drug use: No     Allergies   Augmentin [amoxicillin-pot clavulanate]   Review of Systems Review of Systems   Constitutional:  Positive for activity change and fever. Negative for appetite change and fatigue.  HENT:  Positive for congestion and sore throat. Negative for sinus pressure and sneezing.   Eyes:  Positive for discharge and redness. Negative for photophobia, pain, itching and visual disturbance.  Respiratory:  Positive for cough. Negative for shortness of breath.   Cardiovascular:  Negative for chest pain.  Gastrointestinal:  Negative for abdominal pain, diarrhea, nausea and vomiting.  Neurological:  Positive for headaches. Negative for dizziness and light-headedness.     Physical Exam Triage Vital Signs ED Triage Vitals  Enc Vitals Group     BP 12/14/22 1407 (!) 157/99     Pulse Rate 12/14/22 1407 88     Resp 12/14/22 1407 19     Temp 12/14/22 1407 98.7 F (37.1 C)     Temp Source 12/14/22 1407 Oral     SpO2 12/14/22 1407 97 %     Weight --      Height --      Head Circumference --      Peak Flow --      Pain Score 12/14/22 1408 0     Pain Loc --      Pain Edu? --      Excl. in GC? --    No data found.  Updated Vital Signs BP (!) 157/99 (BP Location: Right Arm)   Pulse 88   Temp 98.7 F (37.1 C) (Oral)   Resp 19   SpO2 97%   Visual Acuity Right Eye Distance:   Left Eye Distance:   Bilateral Distance:    Right Eye Near:   Left Eye Near:    Bilateral Near:     Physical Exam Vitals reviewed.  Constitutional:      General: He is awake.     Appearance: Normal appearance. He is well-developed. He is not ill-appearing.     Comments: Very pleasant male appears stated age in no acute distress sitting comfortably in exam room  HENT:     Head: Normocephalic and atraumatic.     Right Ear: Tympanic membrane, ear canal and external ear normal. Tympanic membrane is not erythematous or bulging.     Left Ear: Tympanic membrane, ear canal and external ear normal. Tympanic membrane is not erythematous or bulging.     Nose:     Right Sinus: Maxillary sinus tenderness  present. No frontal sinus tenderness.     Left Sinus: Maxillary sinus tenderness present. No frontal sinus tenderness.     Mouth/Throat:     Pharynx: Uvula midline. Posterior oropharyngeal erythema present. No oropharyngeal exudate or uvula swelling.  Comments: Erythema and drainage in posterior oropharynx Cardiovascular:     Rate and Rhythm: Normal rate and regular rhythm.     Heart sounds: Normal heart sounds, S1 normal and S2 normal. No murmur heard. Pulmonary:     Effort: Pulmonary effort is normal. No accessory muscle usage or respiratory distress.     Breath sounds: Normal breath sounds. No stridor. No wheezing, rhonchi or rales.     Comments: Clear to auscultation bilaterally Abdominal:     General: Bowel sounds are normal.     Palpations: Abdomen is soft.     Tenderness: There is no abdominal tenderness.  Neurological:     Mental Status: He is alert.  Psychiatric:        Behavior: Behavior is cooperative.      UC Treatments / Results  Labs (all labs ordered are listed, but only abnormal results are displayed) Labs Reviewed - No data to display  EKG   Radiology No results found.  Procedures Procedures (including critical care time)  Medications Ordered in UC Medications - No data to display  Initial Impression / Assessment and Plan / UC Course  I have reviewed the triage vital signs and the nursing notes.  Pertinent labs & imaging results that were available during my care of the patient were reviewed by me and considered in my medical decision making (see chart for details).     Patient is well-appearing, afebrile, nontoxic, nontachycardic.  Chest x-ray was deferred as oxygen saturation is 97% with no adventitious lung sounds on exam.  Given prolonged and recent double worsening of symptoms will cover for secondary bacterial infection.  He was started on doxycycline 100 mg twice daily for 7 days.  Recommend he continue over-the-counter medications including  Mucinex, Tylenol, Flonase, antihistamines.  Also recommended nasal saline and sinus rinses.  Given his significant drainage overnight to the point that he has difficulty opening his eyes in the morning will cover for bacterial conjunctivitis.  Prescription for erythromycin was sent to pharmacy.  Discussed that he is to rest and drink plenty of fluid.  If his symptoms are improving by next week he is to return for reevaluation.  Discussed that if he has worsening symptoms including shortness of breath, worsening cough, fever, weakness he needs to be seen immediately.  Patient declined work excuse note.  Final Clinical Impressions(s) / UC Diagnoses   Final diagnoses:  Acute non-recurrent maxillary sinusitis  Bacterial conjunctivitis     Discharge Instructions      We are going to cover for a bacterial sinus infection.  Please start doxycycline 100 mg twice daily for 7 days.  Stay out of the sun while on this medication.  Use over-the-counter medication including Mucinex, Flonase, Tylenol.  I also recommend nasal saline and sinus rinses for additional symptom relief.  Use erythromycin ointment twice daily for 7 days.  Make sure to wash your hands prior to handling medication and make sure that you do not touch tip of medication bottle to the eye as this can cause contamination of the medicine.  Keep the eye clean with warm compress and use artificial tears for additional symptom relief.  Follow-up with your primary care if symptoms or not significantly improved over the weekend.  If at any point you have worsening symptoms including high fever, shortness of breath, worsening cough, weakness, vision change you need to be seen immediately.     ED Prescriptions     Medication Sig Dispense Auth. Provider   doxycycline (  VIBRAMYCIN) 100 MG capsule Take 1 capsule (100 mg total) by mouth 2 (two) times daily. 14 capsule Manha Amato K, PA-C   erythromycin ophthalmic ointment Place a 1/2 inch ribbon of  ointment into the lower eyelid of both eyes twice daily for 7 days 3.5 g Mccayla Shimada K, PA-C      PDMP not reviewed this encounter.   Jeani Hawking, PA-C 12/14/22 1431

## 2023-01-06 ENCOUNTER — Other Ambulatory Visit: Payer: Self-pay

## 2023-01-06 ENCOUNTER — Emergency Department (HOSPITAL_COMMUNITY): Payer: Managed Care, Other (non HMO)

## 2023-01-06 ENCOUNTER — Emergency Department (HOSPITAL_COMMUNITY)
Admission: EM | Admit: 2023-01-06 | Discharge: 2023-01-06 | Disposition: A | Discharge status: Home / Self Care | Payer: Managed Care, Other (non HMO) | Attending: Emergency Medicine | Admitting: Emergency Medicine

## 2023-01-06 ENCOUNTER — Encounter (HOSPITAL_COMMUNITY): Payer: Self-pay | Admitting: *Deleted

## 2023-01-06 DIAGNOSIS — R0789 Other chest pain: Secondary | ICD-10-CM | POA: Diagnosis not present

## 2023-01-06 DIAGNOSIS — Z79899 Other long term (current) drug therapy: Secondary | ICD-10-CM | POA: Diagnosis not present

## 2023-01-06 DIAGNOSIS — I1A0 Resistant hypertension: Secondary | ICD-10-CM | POA: Diagnosis not present

## 2023-01-06 DIAGNOSIS — R079 Chest pain, unspecified: Secondary | ICD-10-CM

## 2023-01-06 LAB — CBC WITH DIFFERENTIAL/PLATELET
Abs Immature Granulocytes: 0.01 10*3/uL (ref 0.00–0.07)
Basophils Absolute: 0 10*3/uL (ref 0.0–0.1)
Basophils Relative: 0 %
Eosinophils Absolute: 0.1 10*3/uL (ref 0.0–0.5)
Eosinophils Relative: 2 %
HCT: 42.7 % (ref 39.0–52.0)
Hemoglobin: 14.9 g/dL (ref 13.0–17.0)
Immature Granulocytes: 0 %
Lymphocytes Relative: 33 %
Lymphs Abs: 1.7 10*3/uL (ref 0.7–4.0)
MCH: 29.4 pg (ref 26.0–34.0)
MCHC: 34.9 g/dL (ref 30.0–36.0)
MCV: 84.4 fL (ref 80.0–100.0)
Monocytes Absolute: 0.6 10*3/uL (ref 0.1–1.0)
Monocytes Relative: 11 %
Neutro Abs: 2.6 10*3/uL (ref 1.7–7.7)
Neutrophils Relative %: 54 %
Platelets: 257 10*3/uL (ref 150–400)
RBC: 5.06 MIL/uL (ref 4.22–5.81)
RDW: 12.7 % (ref 11.5–15.5)
WBC: 5 10*3/uL (ref 4.0–10.5)
nRBC: 0 % (ref 0.0–0.2)

## 2023-01-06 LAB — BASIC METABOLIC PANEL
Anion gap: 12 (ref 5–15)
BUN: 15 mg/dL (ref 6–20)
CO2: 23 mmol/L (ref 22–32)
Calcium: 9.2 mg/dL (ref 8.9–10.3)
Chloride: 101 mmol/L (ref 98–111)
Creatinine, Ser: 0.9 mg/dL (ref 0.61–1.24)
GFR, Estimated: >60 mL/min (ref >60)
Glucose, Bld: 100 mg/dL — ABNORMAL HIGH (ref 70–99)
Potassium: 3.7 mmol/L (ref 3.5–5.1)
Sodium: 136 mmol/L (ref 135–145)

## 2023-01-06 LAB — BRAIN NATRIURETIC PEPTIDE: B Natriuretic Peptide: 15 pg/mL (ref 0.0–100.0)

## 2023-01-06 LAB — TROPONIN I (HIGH SENSITIVITY)
Troponin I (High Sensitivity): <2 ng/L (ref <18)
Troponin I (High Sensitivity): 2 ng/L (ref <18)

## 2023-01-06 MED ORDER — ASPIRIN 81 MG PO CHEW
324.0000 mg | CHEWABLE_TABLET | Freq: Once | ORAL | Status: DC
  Filled 2023-01-06: qty 4

## 2023-01-06 NOTE — ED Provider Notes (Signed)
[place] EMERGENCY DEPARTMENT AT [facility] Provider Note   CSN: [id number] Arrival date & time: 01/06/23  1059     History  Chief Complaint  Patient presents with   Chest Pain    Kent Pearson is a 52 y.o. male.  He is here with a complaint of discomfort in his left chest into his left arm and neck that started today while getting ready for church.  He denies prior history of cardiac disease.  Rates the pain as 3 out of 10 no change with exertion or movement.  No known trauma.  He also said he has been just generally fatigued for about a week.  No prior history of cardiac disease.  Has tried nothing for his symptoms.  The history is provided by the patient.  Chest Pain Pain location:  L chest Pain quality: aching   Pain radiates to:  Neck, L shoulder and L arm Pain severity:  Mild Onset quality:  Gradual Duration:  1 day Timing:  Constant Progression:  Unchanged Chronicity:  New Relieved by:  None tried Worsened by:  Nothing Ineffective treatments:  None tried Associated symptoms: no abdominal pain, no cough, no diaphoresis, no dizziness, no fever, no nausea, no numbness, no shortness of breath, no vomiting and no weakness   Risk factors: no coronary artery disease and no smoking        Home Medications Prior to Admission medications   Medication Sig Start Date End Date Taking? Authorizing Provider  cholecalciferol (VITAMIN D3) 25 MCG (1000 UT) tablet Take 1,000 Units by mouth daily. Patient not taking: Reported on 09/24/2022    [provider]  doxycycline (VIBRAMYCIN) 100 MG capsule Take 1 capsule (100 mg total) by mouth 2 (two) times daily. 12/14/22   Raspet, Noberto Retort, PA-C  erythromycin ophthalmic ointment Place a 1/2 inch ribbon of ointment into the lower eyelid of both eyes twice daily for 7 days 12/14/22   Raspet, Erin K, PA-C  ibuprofen (ADVIL) 200 MG tablet Take 200 mg by mouth every 6 (six) hours as needed.    [provider]   loratadine (ALLERGY) 10 MG tablet Take 10 mg by mouth every evening.    [provider]  LORazepam (ATIVAN) 0.5 MG tablet Take 1/2 to 1 tablet q 8 h prn for vestibular neuritis symptoms. 11/29/21   Mardella Layman, MD  Multiple Vitamin (MULTIVITAMIN WITH MINERALS) TABS tablet Take 1 tablet by mouth daily.    [provider]  omeprazole (PRILOSEC) 20 MG capsule Take 20 mg by mouth daily.    [provider]      Allergies    Augmentin [amoxicillin-pot clavulanate]    Review of Systems   Review of Systems  Constitutional:  Negative for diaphoresis and fever.  HENT:  Negative for sore throat.   Respiratory:  Negative for cough and shortness of breath.   Cardiovascular:  Positive for chest pain.  Gastrointestinal:  Negative for abdominal pain, nausea and vomiting.  Genitourinary:  Negative for dysuria.  Skin:  Negative for rash.  Neurological:  Negative for dizziness, weakness and numbness.    Physical Exam Updated Vital Signs BP (!) 162/107   Pulse 80   Resp 11   Ht 5\' 10"  (1.778 m)   Wt 86.2 kg   SpO2 97%   BMI 27.26 kg/m  Physical Exam Vitals and nursing note reviewed.  Constitutional:      General: He is not in acute distress.    Appearance: He  is well-developed.  HENT:     Head: Normocephalic and atraumatic.  Eyes:     Conjunctiva/sclera: Conjunctivae normal.  Cardiovascular:     Rate and Rhythm: Normal rate and regular rhythm.     Heart sounds: Normal heart sounds. No murmur heard. Pulmonary:     Effort: Pulmonary effort is normal. No respiratory distress.     Breath sounds: Normal breath sounds.  Abdominal:     Palpations: Abdomen is soft.     Tenderness: There is no abdominal tenderness.  Musculoskeletal:        General: No swelling. Normal range of motion.     Cervical back: Neck supple.     Right lower leg: No tenderness. No edema.     Left lower leg: No tenderness. No edema.  Skin:    General: Skin is warm and dry.     Capillary  Refill: Capillary refill takes less than 2 seconds.  Neurological:     General: No focal deficit present.     Mental Status: He is alert.     ED Results / Procedures / Treatments   Labs (all labs ordered are listed, but only abnormal results are displayed) Labs Reviewed  BASIC METABOLIC PANEL - Abnormal; Notable for the following components:      Result Value   Glucose, Bld 100 (*)    All other components within normal limits  CBC WITH DIFFERENTIAL/PLATELET  BRAIN NATRIURETIC PEPTIDE  TROPONIN I (HIGH SENSITIVITY)  TROPONIN I (HIGH SENSITIVITY)    EKG EKG Interpretation  Date/Time:  Sunday Jan 06 2023 11:11:25 EDT Ventricular Rate:  81 PR Interval:  165 QRS Duration: 89 QT Interval:  361 QTC Calculation: 419 R Axis:   60 Text Interpretation: Sinus rhythm No significant change since prior 12/20 Confirmed by Meridee Score [id number]) on 01/06/2023 11:16:11 AM  Radiology DG Chest Port 1 View  Result Date: 01/06/2023 CLINICAL DATA:  Left-sided chest pain EXAM: PORTABLE CHEST 1 VIEW COMPARISON:  08/06/2019 FINDINGS: The heart size and mediastinal contours are within normal limits. Mild diffuse interstitial opacity. The visualized skeletal structures are unremarkable. IMPRESSION: Mild diffuse interstitial pulmonary opacity, suggesting edema or atypical/viral infection. No focal airspace opacity. Electronically Signed   By: Jearld Lesch M.D.   On: 01/06/2023 12:00    Procedures Procedures    Medications Ordered in ED Medications  aspirin chewable tablet 324 mg (has no administration in time range)    ED Course/ Medical Decision Making/ A&P Clinical Course as of 01/06/23 1833  Sun Jan 06, 2023  1146 Patient states he is intolerant to aspirin and is declining it. [MB]  1201 Chest x-ray does show any acute infiltrate or pneumothorax.  Awaiting radiology reading. [MB]    Clinical Course User Index [MB] Terrilee Files, MD                             Medical Decision  Making Amount and/or Complexity of Data Reviewed Labs: ordered. Radiology: ordered.  Risk OTC drugs.   This patient complains of some malaise and fatigue, left-sided chest pain into neck and arm; this involves an extensive number of treatment Options and is a complaint that carries with it a high risk of complications and morbidity. The differential includes ACS, pneumonia, pneumothorax, PE, vascular, viral syndrome  I ordered, reviewed and interpreted labs, which included CBC normal chemistries normal troponins flat BMP negative I ordered medication oral aspirin, patient declined and reviewed PMP when  indicated. I ordered imaging studies which included chest x-ray and I independently    visualized and interpreted imaging which showed mild diffuse interstitial opacity possible edema possible infection Additional history obtained from patient's wife Previous records obtained and reviewed in epic no recent admissions Cardiac monitoring reviewed, normal sinus rhythm Social determinants considered, no significant barriers Critical Interventions: None  After the interventions stated above, I reevaluated the patient and found patient to be awake alert no distress satting 96% on room air Admission and further testing considered, no indications for admission or further workup at this time.  Recommended close follow-up with PCP and monitoring for symptoms such as cough or fever.  Return instructions discussed         Final Clinical Impression(s) / ED Diagnoses Final diagnoses:  Nonspecific chest pain  Resistant hypertension    Rx / DC Orders ED Discharge Orders     None         Terrilee Files, MD 01/06/23 1836

## 2023-01-06 NOTE — Discharge Instructions (Signed)
You were seen in the emergency department for not feeling well for a week and left-sided chest pain today.  You had blood work EKG and chest x-ray that did not show any evidence of heart attack.  Your blood pressure was elevated here and this will need follow-up with your primary care doctor.  Continue regular medications.  Return to the emergency department if any worsening or concerning symptoms.

## 2023-01-06 NOTE — ED Triage Notes (Signed)
Pt c/o left side chest pain that radiates down his left arm and he describes it as a discomfort

## 2023-10-19 ENCOUNTER — Ambulatory Visit
Admission: RE | Admit: 2023-10-19 | Discharge: 2023-10-19 | Disposition: A | Discharge status: Home / Self Care | Payer: Self-pay | Source: Ambulatory Visit | Attending: Family Medicine

## 2023-10-19 VITALS — BP 160/94 | HR 73 | Temp 98.4°F | Resp 18

## 2023-10-19 DIAGNOSIS — R42 Dizziness and giddiness: Secondary | ICD-10-CM

## 2023-10-19 DIAGNOSIS — R030 Elevated blood-pressure reading, without diagnosis of hypertension: Secondary | ICD-10-CM | POA: Diagnosis not present

## 2023-10-19 LAB — POCT FASTING CBG KUC MANUAL ENTRY: POCT Glucose (KUC): 97 mg/dL (ref 70–99)

## 2023-10-19 NOTE — Discharge Instructions (Signed)
 Apart from your elevated blood pressure reading, your workup here is very reassuring.  Follow-up as scheduled on Monday with your primary care provider and go to the emergency department for severe worsening symptoms at any time.  Continue logging your home blood pressure readings and bring this to your appointment on Monday

## 2023-10-19 NOTE — ED Provider Notes (Signed)
 [facility]    CSN: [id number] Arrival date & time: 10/19/23  1349      History   Chief Complaint Chief Complaint  Patient presents with   Hypertension    Entered by patient    HPI Kent Pearson is a 53 y.o. male.   Patient presenting today with several day history of persistently elevated blood pressure readings.  States for the past week or so he has felt very lightheaded and had an episode at onset where he had to go lay down on the floor for about 30 minutes as he got a gnawing feeling in his stomach and then felt like he was going to pass out.  He states he did not pass out and has had no similar episodes since apart from just feeling generalized fatigue and lightheadedness.  Was seen by his PCP yesterday, labs pending for further evaluation of symptoms but these are still pending for patient.  He states he has been dealing with persistently mildly elevated blood pressure readings for several years but he has noticed over the past few days since he started checking at home that they have been elevated to the 150s to 160s over 90s range consistently.  Denies chest pain, shortness of breath, palpitations, visual change, severe headache.  So far not trying anything over-the-counter for symptoms.    Past Medical History:  Diagnosis Date   Acid reflux    Helicobacter pylori gastritis    18+ years ago per pt report    Patient Active Problem List   Diagnosis Date Noted   Positive colorectal cancer screening using Cologuard test 09/24/2022   Abdominal pain 09/24/2022   History of COVID-19 08/18/2020   Elevated blood pressure 01/02/2016   Vestibular neuritis 06/23/2013   Headache 06/03/2013   GERD (gastroesophageal reflux disease) 05/26/2013   Benign paroxysmal positional vertigo 04/24/2013   Gastritis 04/01/2013   Epigastric pain 07/19/2011    Past Surgical History:  Procedure Laterality Date   CHOLECYSTECTOMY  1993   collapsed lung  1988   high school    EYE SURGERY  1983   left       Home Medications    Prior to Admission medications   Medication Sig Start Date End Date Taking? Authorizing Provider  cholecalciferol (VITAMIN D3) 25 MCG (1000 UT) tablet Take 1,000 Units by mouth daily. Patient not taking: Reported on 09/24/2022    [provider]  doxycycline (VIBRAMYCIN) 100 MG capsule Take 1 capsule (100 mg total) by mouth 2 (two) times daily. 12/14/22   Raspet, Noberto Retort, PA-C  erythromycin ophthalmic ointment Place a 1/2 inch ribbon of ointment into the lower eyelid of both eyes twice daily for 7 days 12/14/22   Raspet, Erin K, PA-C  ibuprofen (ADVIL) 200 MG tablet Take 200 mg by mouth every 6 (six) hours as needed.    [provider]  loratadine (ALLERGY) 10 MG tablet Take 10 mg by mouth every evening.    [provider]  LORazepam (ATIVAN) 0.5 MG tablet Take 1/2 to 1 tablet q 8 h prn for vestibular neuritis symptoms. 11/29/21   Mardella Layman, MD  Multiple Vitamin (MULTIVITAMIN WITH MINERALS) TABS tablet Take 1 tablet by mouth daily.    [provider]  omeprazole (PRILOSEC) 20 MG capsule Take 20 mg by mouth daily.    [provider]    Family History Family History  Problem Relation Age of Onset   Colon cancer Neg Hx    Ataxia  Neg Hx    Chorea Neg Hx    Dementia Neg Hx    Mental retardation Neg Hx    Migraines Neg Hx    Multiple sclerosis Neg Hx    Neurofibromatosis Neg Hx    Neuropathy Neg Hx    Parkinsonism Neg Hx    Seizures Neg Hx    Stroke Neg Hx     Social History Social History   Tobacco Use   Smoking status: Never   Smokeless tobacco: Never  Vaping Use   Vaping status: Never Used  Substance Use Topics   Alcohol use: No   Drug use: No     Allergies   Augmentin [amoxicillin-pot clavulanate]   Review of Systems Review of Systems PER  HPI  Physical Exam Triage Vital Signs ED Triage Vitals  Encounter Vitals Group     BP 10/19/23 1416 (!) 160/94     Systolic  BP Percentile --      Diastolic BP Percentile --      Pulse Rate 10/19/23 1416 73     Resp 10/19/23 1416 18     Temp 10/19/23 1416 98.4 F (36.9 C)     Temp Source 10/19/23 1416 Oral     SpO2 10/19/23 1416 97 %     Weight --      Height --      Head Circumference --      Peak Flow --      Pain Score 10/19/23 1418 0     Pain Loc --      Pain Education --      Exclude from Growth Chart --    Orthostatic VS for the past 24 hrs:  BP- Lying Pulse- Lying BP- Sitting Pulse- Sitting BP- Standing at 0 minutes Pulse- Standing at 0 minutes  10/19/23 1456 (!) 158/103 75 (!) 179/104 79 (!) 184/111 75    Updated Vital Signs BP (!) 160/94 (BP Location: Right Arm)   Pulse 73   Temp 98.4 F (36.9 C) (Oral)   Resp 18   SpO2 97%   Visual Acuity Right Eye Distance:   Left Eye Distance:   Bilateral Distance:    Right Eye Near:   Left Eye Near:    Bilateral Near:     Physical Exam Vitals and nursing note reviewed.  Constitutional:      Appearance: Normal appearance.  HENT:     Head: Atraumatic.  Eyes:     Extraocular Movements: Extraocular movements intact.     Conjunctiva/sclera: Conjunctivae normal.  Cardiovascular:     Rate and Rhythm: Normal rate and regular rhythm.  Pulmonary:     Effort: Pulmonary effort is normal.     Breath sounds: Normal breath sounds.  Musculoskeletal:        General: Normal range of motion.     Cervical back: Normal range of motion and neck supple.  Skin:    General: Skin is warm and dry.  Neurological:     General: No focal deficit present.     Mental Status: He is oriented to person, place, and time.     Motor: No weakness.     Gait: Gait normal.  Psychiatric:        Mood and Affect: Mood normal.        Thought Content: Thought content normal.        Judgment: Judgment normal.      UC Treatments / Results  Labs (all labs ordered are listed, but only abnormal  results are displayed) Labs Reviewed  POCT FASTING CBG [facility] MANUAL ENTRY     EKG   Radiology No results found.  Procedures Procedures (including critical care time)  Medications Ordered in UC Medications - No data to display  Initial Impression / Assessment and Plan / UC Course  I have reviewed the triage vital signs and the nursing notes.  Pertinent labs & imaging results that were available during my care of the patient were reviewed by me and considered in my medical decision making (see chart for details).     Elevated blood pressure reading, otherwise vital signs within normal limits.  No significant change during orthostatic vital signs, point-of-care glucose within normal limits, EKG today normal sinus rhythm at 68 bpm without acute ST or T wave changes.  Labs pending per PCP appointment yesterday.  Blood pressure elevations are ongoing and chronic, he is interested in starting blood pressure medication today but discussed that this would be a decision made by his primary care provider which he has an appointment in 2 days with.  ED for significantly worsening symptoms at any time.  Final Clinical Impressions(s) / UC Diagnoses   Final diagnoses:  Elevated blood pressure reading  Lightheaded     Discharge Instructions      Apart from your elevated blood pressure reading, your workup here is very reassuring.  Follow-up as scheduled on Monday with your primary care provider and go to the emergency department for severe worsening symptoms at any time.  Continue logging your home blood pressure readings and bring this to your appointment on Monday    ED Prescriptions   None    PDMP not reviewed this encounter.   Particia Nearing, [place] 10/19/23 1523

## 2023-10-19 NOTE — ED Triage Notes (Signed)
 Pt reports he has been having elevated BP (has log of readings x 2 days.)

## 2024-05-27 ENCOUNTER — Encounter (INDEPENDENT_AMBULATORY_CARE_PROVIDER_SITE_OTHER): Payer: Self-pay | Admitting: Gastroenterology
